# Patient Record
Sex: Female | Born: 1975 | Race: White | Hispanic: No | Marital: Married | Smoking: Current every day smoker
Health system: Southern US, Community
[De-identification: ages and names within clinical notes are randomized; demographics above are authoritative.]

## PROBLEM LIST (undated history)

## (undated) ENCOUNTER — Emergency Department: Discharge status: Absent without leave | Payer: Self-pay

## (undated) DIAGNOSIS — R569 Unspecified convulsions: Secondary | ICD-10-CM

## (undated) DIAGNOSIS — J45909 Unspecified asthma, uncomplicated: Secondary | ICD-10-CM

## (undated) DIAGNOSIS — E079 Disorder of thyroid, unspecified: Secondary | ICD-10-CM

## (undated) HISTORY — PX: ABDOMINAL HYSTERECTOMY: SHX81

## (undated) HISTORY — PX: OTHER SURGICAL HISTORY: SHX169

## (undated) HISTORY — PX: ABDOMINAL SURGERY: SHX537

## (undated) HISTORY — PX: BACK SURGERY: SHX140

---

## 2005-05-15 ENCOUNTER — Emergency Department: Payer: Self-pay | Admitting: Unknown Physician Specialty

## 2006-03-31 ENCOUNTER — Emergency Department: Payer: Self-pay | Admitting: Emergency Medicine

## 2006-04-03 ENCOUNTER — Ambulatory Visit: Payer: Self-pay | Admitting: Emergency Medicine

## 2006-05-08 ENCOUNTER — Inpatient Hospital Stay: Payer: Self-pay | Admitting: Obstetrics and Gynecology

## 2006-08-04 ENCOUNTER — Emergency Department: Payer: Self-pay | Admitting: Emergency Medicine

## 2006-09-09 ENCOUNTER — Emergency Department: Payer: Self-pay | Admitting: Emergency Medicine

## 2006-12-03 ENCOUNTER — Emergency Department: Payer: Self-pay | Admitting: Internal Medicine

## 2006-12-03 ENCOUNTER — Other Ambulatory Visit: Payer: Self-pay

## 2007-04-29 ENCOUNTER — Emergency Department: Payer: Self-pay | Admitting: Emergency Medicine

## 2007-11-01 ENCOUNTER — Emergency Department: Payer: Self-pay

## 2007-11-01 ENCOUNTER — Other Ambulatory Visit: Payer: Self-pay

## 2008-06-28 ENCOUNTER — Other Ambulatory Visit: Payer: Self-pay

## 2008-06-28 ENCOUNTER — Emergency Department: Payer: Self-pay | Admitting: Emergency Medicine

## 2009-07-16 ENCOUNTER — Ambulatory Visit: Payer: Self-pay | Admitting: Family Medicine

## 2009-08-19 ENCOUNTER — Ambulatory Visit: Payer: Self-pay | Admitting: Gastroenterology

## 2009-08-24 ENCOUNTER — Ambulatory Visit: Payer: Self-pay | Admitting: Gastroenterology

## 2009-10-05 ENCOUNTER — Emergency Department: Payer: Self-pay

## 2009-12-05 ENCOUNTER — Emergency Department: Payer: Self-pay | Admitting: Emergency Medicine

## 2010-02-02 ENCOUNTER — Emergency Department: Payer: Self-pay | Admitting: Emergency Medicine

## 2010-11-23 ENCOUNTER — Inpatient Hospital Stay: Payer: Self-pay | Admitting: Internal Medicine

## 2010-12-29 ENCOUNTER — Emergency Department: Payer: Self-pay | Admitting: Emergency Medicine

## 2011-04-24 ENCOUNTER — Emergency Department: Payer: Self-pay | Admitting: Emergency Medicine

## 2011-07-21 ENCOUNTER — Inpatient Hospital Stay: Payer: Self-pay | Admitting: Internal Medicine

## 2011-11-24 LAB — BASIC METABOLIC PANEL
Anion Gap: 11 (ref 7–16)
Calcium, Total: 9.5 mg/dL (ref 8.5–10.1)
Chloride: 109 mmol/L — ABNORMAL HIGH (ref 98–107)
EGFR (African American): >60
EGFR (Non-African Amer.): >60
Glucose: 107 mg/dL — ABNORMAL HIGH (ref 65–99)
Osmolality: 287 (ref 275–301)
Sodium: 144 mmol/L (ref 136–145)

## 2011-11-24 LAB — CBC
HGB: 13.6 g/dL (ref 12.0–16.0)
MCH: 27.9 pg (ref 26.0–34.0)
MCV: 83 fL (ref 80–100)
Platelet: 169 10*3/uL (ref 150–440)
RBC: 4.89 10*6/uL (ref 3.80–5.20)

## 2011-11-24 LAB — TSH: Thyroid Stimulating Horm: <0.01 u[IU]/mL — ABNORMAL LOW

## 2011-11-25 ENCOUNTER — Inpatient Hospital Stay: Payer: Self-pay | Admitting: Specialist

## 2011-11-25 LAB — DRUG SCREEN, URINE
Benzodiazepine, Ur Scrn: NEGATIVE (ref ?–200)
Cannabinoid 50 Ng, Ur ~~LOC~~: POSITIVE (ref ?–50)
Cocaine Metabolite,Ur ~~LOC~~: NEGATIVE (ref ?–300)
MDMA (Ecstasy)Ur Screen: NEGATIVE (ref ?–500)
Methadone, Ur Screen: NEGATIVE (ref ?–300)
Opiate, Ur Screen: POSITIVE (ref ?–300)
Phencyclidine (PCP) Ur S: NEGATIVE (ref ?–25)

## 2011-11-25 LAB — PREGNANCY, URINE: Pregnancy Test, Urine: NEGATIVE m[IU]/mL

## 2011-11-25 LAB — T4, FREE: Free Thyroxine: 6.14 ng/dL — ABNORMAL HIGH (ref 0.76–1.46)

## 2011-11-25 LAB — TROPONIN I: Troponin-I: <0.02 ng/mL

## 2012-11-06 ENCOUNTER — Emergency Department: Payer: Self-pay | Admitting: Emergency Medicine

## 2012-12-12 ENCOUNTER — Emergency Department: Payer: Self-pay | Admitting: Emergency Medicine

## 2013-05-07 ENCOUNTER — Emergency Department: Payer: Self-pay | Admitting: Emergency Medicine

## 2013-08-20 ENCOUNTER — Emergency Department: Payer: Self-pay | Admitting: Emergency Medicine

## 2013-08-20 LAB — HEPATIC FUNCTION PANEL A (ARMC)
Albumin: 4.1 g/dL (ref 3.4–5.0)
Alkaline Phosphatase: 103 U/L (ref 50–136)
Bilirubin, Direct: <0.1 mg/dL (ref 0.00–0.20)
Bilirubin,Total: 0.3 mg/dL (ref 0.2–1.0)
SGOT(AST): 24 U/L (ref 15–37)
Total Protein: 7.3 g/dL (ref 6.4–8.2)

## 2013-08-20 LAB — URINALYSIS, COMPLETE
Blood: NEGATIVE
Glucose,UR: NEGATIVE mg/dL (ref 0–75)
Ketone: NEGATIVE
Leukocyte Esterase: NEGATIVE
Nitrite: NEGATIVE
Ph: 7 (ref 4.5–8.0)
Protein: NEGATIVE
Specific Gravity: 1.006 (ref 1.003–1.030)
Squamous Epithelial: 7

## 2013-08-20 LAB — CBC
MCH: 31.7 pg (ref 26.0–34.0)
MCHC: 35.7 g/dL (ref 32.0–36.0)
RBC: 4.7 10*6/uL (ref 3.80–5.20)
RDW: 13.7 % (ref 11.5–14.5)
WBC: 5.5 10*3/uL (ref 3.6–11.0)

## 2013-08-20 LAB — DRUG SCREEN, URINE
Barbiturates, Ur Screen: NEGATIVE (ref ?–200)
Benzodiazepine, Ur Scrn: NEGATIVE (ref ?–200)
Cannabinoid 50 Ng, Ur ~~LOC~~: NEGATIVE (ref ?–50)
MDMA (Ecstasy)Ur Screen: NEGATIVE (ref ?–500)
Opiate, Ur Screen: NEGATIVE (ref ?–300)
Tricyclic, Ur Screen: NEGATIVE (ref ?–1000)

## 2013-08-20 LAB — BASIC METABOLIC PANEL
Anion Gap: 7 (ref 7–16)
BUN: 7 mg/dL (ref 7–18)
Chloride: 106 mmol/L (ref 98–107)
EGFR (African American): >60
Osmolality: 273 (ref 275–301)

## 2013-08-20 LAB — PREGNANCY, URINE: Pregnancy Test, Urine: NEGATIVE m[IU]/mL

## 2013-08-20 LAB — MAGNESIUM: Magnesium: 1.6 mg/dL — ABNORMAL LOW

## 2013-08-20 LAB — TSH: Thyroid Stimulating Horm: 5.52 u[IU]/mL — ABNORMAL HIGH

## 2013-10-29 ENCOUNTER — Emergency Department: Payer: Self-pay | Admitting: Emergency Medicine

## 2014-02-25 ENCOUNTER — Ambulatory Visit: Payer: Self-pay | Admitting: Emergency Medicine

## 2014-04-15 ENCOUNTER — Ambulatory Visit: Payer: Self-pay | Admitting: Family Medicine

## 2014-06-21 ENCOUNTER — Emergency Department: Payer: Self-pay

## 2014-11-02 ENCOUNTER — Emergency Department: Payer: Self-pay | Admitting: Emergency Medicine

## 2014-11-02 LAB — CBC WITH DIFFERENTIAL/PLATELET
Basophil #: 0.1 10*3/uL (ref 0.0–0.1)
Basophil %: 1.2 %
EOS ABS: 0.2 10*3/uL (ref 0.0–0.7)
Eosinophil %: 4.2 %
HCT: 43.6 % (ref 35.0–47.0)
HGB: 14.7 g/dL (ref 12.0–16.0)
Lymphocyte #: 1.5 10*3/uL (ref 1.0–3.6)
Lymphocyte %: 27.9 %
MCH: 31.8 pg (ref 26.0–34.0)
MCHC: 33.8 g/dL (ref 32.0–36.0)
MCV: 94 fL (ref 80–100)
MONO ABS: 0.3 x10 3/mm (ref 0.2–0.9)
Monocyte %: 5.6 %
Neutrophil #: 3.4 10*3/uL (ref 1.4–6.5)
Neutrophil %: 61.1 %
PLATELETS: 164 10*3/uL (ref 150–440)
RBC: 4.63 10*6/uL (ref 3.80–5.20)
RDW: 13.6 % (ref 11.5–14.5)
WBC: 5.5 10*3/uL (ref 3.6–11.0)

## 2014-11-02 LAB — COMPREHENSIVE METABOLIC PANEL
ALT: 17 U/L (ref 14–63)
AST: 24 U/L (ref 15–37)
Albumin: 3.8 g/dL (ref 3.4–5.0)
Alkaline Phosphatase: 71 U/L (ref 46–116)
Anion Gap: 6 — ABNORMAL LOW (ref 7–16)
BUN: 13 mg/dL (ref 7–18)
Bilirubin,Total: 0.2 mg/dL (ref 0.2–1.0)
CHLORIDE: 110 mmol/L — AB (ref 98–107)
Calcium, Total: 6.8 mg/dL — CL (ref 8.5–10.1)
Co2: 26 mmol/L (ref 21–32)
Creatinine: 1.03 mg/dL (ref 0.60–1.30)
EGFR (Non-African Amer.): >60
Glucose: 76 mg/dL (ref 65–99)
Osmolality: 282 (ref 275–301)
Potassium: 3.6 mmol/L (ref 3.5–5.1)
Sodium: 142 mmol/L (ref 136–145)
TOTAL PROTEIN: 7.1 g/dL (ref 6.4–8.2)

## 2014-11-02 LAB — TROPONIN I

## 2014-11-02 LAB — LIPASE, BLOOD: Lipase: 132 U/L (ref 73–393)

## 2015-01-25 NOTE — Discharge Summary (Signed)
PATIENT NAME:  Debbie [name], Debbie [name] MR#:  [id number] DATE OF BIRTH:  11/01/1975  DATE OF ADMISSION:  11/25/2011 DATE OF DISCHARGE:  11/25/2011  For [name] detailed note, please take [name] look at the history and physical done on admission by Dr. Rudene Rearwish.   DIAGNOSES AT DISCHARGE:  1. Chest pain likely musculoskeletal in nature.  2. Hypothyroidism, noncompliant with her medication.   3. Asthma.   4. Chronic obstructive pulmonary disease with ongoing tobacco abuse.   DIET: The patient was discharged on [name] regular diet.   ACTIVITY: As tolerated. The patient was told to [facility] so she can get help with her medications.   DISCHARGE MEDICATIONS:  1. Albuterol inhaler 2 puffs q.6 hours as needed  2. Tapazole 10 mg q.8 hours.  3. Toprol 25 mg daily.   PERTINENT STUDIES DONE DURING THE HOSPITAL COURSE: CT scan of the chest done with contrast showing no evidence of pulmonary or arterial embolic disease.   HOSPITAL COURSE: This is [name] 39 year old female with medical problems as mentioned above who presented to the hospital with chest pain and palpitations.  1. Chest pain. The patient's chest pain was likely related to musculoskeletal in nature and no evidence of any cardiac or pulmonary disease. She had [name] CT of the chest which showed no evidence of any pulmonary emboli. She had 3 sets of troponins checked which were negative. She was told to take some Tylenol and Motrin as needed for her chest pain as an outpatient.  2. Palpitations. This was secondary to tachycardia from pt's Hyperthyroid state, Graves disease. The patient has [name] history of Graves disease and is supposed to be on methimazole but has not been taking it for the past 2 to 3 months secondary to financial issues. Tachycardia was controlled with some Toprol, and she was discharged on some Toprol. Her heart rate has slowed down after being on beta blockers.  3. Graves disease with history of hyperthyroidism. The patient was in the  hospital for hyperthyroid state in October of last year, was discharged on Tapazole and was [facility]. The patient has not been taking her methimazole as she is supposed to for the past 2 months due to financial reasons. I got Case Management involved. Case Manager was able to get her the Tapazole at [name] cheaper price [facility] where the copay would only be $15 but patient said she could not afford that either. The patient was therefore given [name] week's worth of Tapazole and then told to [facility]. I spoke to Dr. Tedd SiasSolum who said that if the patient shows [facility] and shows her that she cannot afford her medications and show them [name] bank statement, she should be able to get her medications pretty much for free. The patient I think is noncompliant and did not want to take her medications but did not want to go through this process. The best we could do provide her medications for [name] week and for her to [facility] regarding her hyperthyroid state. The patient eventually is to get [name] radioactive ablation and also possible thyroidectomy, but this is after she has been compliant with her methimazole and the thyroid gland has sort of calmed down.  4. Chronic obstructive pulmonary disease/asthma with ongoing tobacco abuse. The patient did not have any evidence of acute exacerbation. She will take her albuterol inhaler as needed.  CODE STATUS: THE PATIENT IS [name] FULL CODE.   TIME SPENT: 35 minutes.     ____________________________ Rolly Pancake. Cherlynn Kaiser, MD vjs:vtd D: 11/25/2011 15:59:25 ET T: 11/26/2011 12:09:53 ET JOB#: [id number]  cc: Rolly Pancake. Cherlynn Kaiser, MD, <Dictator> Houston Siren MD ELECTRONICALLY SIGNED 11/30/2011 15:06

## 2015-01-25 NOTE — H&P (Signed)
PATIENT NAME:  Edwinna AreolaHORNTON, Alyah A MR#:  [id number] DATE OF BIRTH:  1976-06-06  DATE OF ADMISSION:  11/25/2011  PRIMARY CARE PHYSICIAN: Dr. Gustavo Laheresa Fralix    CHIEF COMPLAINT: Mouth numbness, palpitations, left-sided chest pain.   HISTORY OF PRESENT ILLNESS: Ms. Talmadge Coventryhornton is a 39 year old Caucasian female known case of Graves disease and treated for hyperthyroidism last admission when she presented with left-sided chest pain and palpitations. It is unfortunate that this patient had lost her Medicaid. She did not take her medications, in particular the Toprol and methimazole. The plan was for her to have either radioactive iodine ablation or surgery after control of her thyrotoxic state, however, she keeps losing her Medicaid and she does not take her medications. The patient came to the Emergency Room when she called her endocrinologist reporting that she has perioral numbness associated with tremors and palpitations, then later she developed left-sided chest pain described as dull aching pain with occasional shocking pain chest pain every now and then. She said it is severe reaching a 10 on a scale of 10. There is no associated shortness of breath. No syncope or near syncope.   REVIEW OF SYSTEMS: CONSTITUTIONAL: Denies fever. No chills. Reports sweating. No fatigue. EYES: No blurring of vision. No double vision. ENT: No hearing impairment. No sore throat. No dysphagia. CARDIOVASCULAR: Reports chest pain. No shortness of breath. No edema. No syncope. RESPIRATORY: No shortness of breath. No cough. No sputum production. GASTROINTESTINAL: No abdominal pain. Has occasional nausea for which she takes [place]. No vomiting. No diarrhea. GENITOURINARY: No dysuria or frequency of urination. MUSCULOSKELETAL: No joint pain or swelling. No muscular pain or swelling. INTEGUMENTARY: No skin rash. No ulcers. NEUROLOGIC: No focal weakness. No seizure activity. No headache. PSYCHIATRY: She has some anxiety. No depression.  ENDOCRINE: Reports sweating, heat and cold intolerance.   PAST MEDICAL HISTORY:  1. History of Graves disease. 2. Hyperthyroidism.  3. History of asthma.  4. Tobacco abuse. 5. History of partial hysterectomy.   SOCIAL HABITS: Chronic smoker, 1 pack per day since age 39. No history of alcohol or other drug abuse.   SOCIAL HISTORY: She is married, living with her husband. 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   FAMILY HISTORY: Reporting he mother had hypertension.   ADMISSION MEDICATIONS: None. She takes only Phenergan.   ALLERGIES: Aspirin causing mild rash and morphine also causes itching and mild rash.   PHYSICAL EXAMINATION:  VITAL SIGNS: Blood pressure 120/49, pulse 124 per minute, respiratory rate 20, temperature 97.8, oxygen saturation 97%.   GENERAL APPEARANCE: Young female lying in bed in no acute distress, thin looking.   HEAD AND NECK EXAMINATION: No pallor. No icterus. No cyanosis.   ENT: Hearing was normal. Nasal mucosa, lips, tongue were normal. The tongue is pierced.   EYES: Normal iris and conjunctivae. Pupils about 8 mm, equal and reactive to light.   NECK: Supple. Trachea at midline. There is thyromegaly diffuse more so on right than the left. There is bruit heard on both sides of the thyroid gland.   HEART: Normal S1, S2. No S3, S4. There is grade 1/6 systolic murmur at the left sternal border and aortic area.   RESPIRATORY: Normal breathing pattern without use of accessory muscles. No rales. No wheezing.   ABDOMEN: Soft without tenderness. No hepatosplenomegaly. No masses. No hernias.   SKIN: No ulcers. No subcutaneous nodules.   MUSCULOSKELETAL: No joint swelling. No clubbing.   NEUROLOGIC: Cranial nerves II through XII are intact. No focal  motor deficit. She has bilateral hand fine tremors.   LABORATORY, DIAGNOSTIC AND RADIOLOGICAL DATA: Serum glucose 107, BUN 12, creatinine 0.6, sodium 144, potassium 4.4. Troponin less than 0.02. TSH was less than  0.010. CBC showed white count of 7600, hemoglobin 13, hematocrit 40, platelet count 169. D-dimer was elevated at 0.6, however, CT scan of the chest was negative for pulmonary embolism as preliminary results.   ASSESSMENT:  1. Left-sided chest pain, etiology is unclear, but this is same presentation of last time when she presented with thyrotoxicosis associated with chest pain. At that time it was thought musculoskeletal chest pain.  2. Thyroid toxicosis. 3. Graves disease.  4. Sinus tachycardia secondary to hyperthyroidism.  5. Tobacco abuse.  6. History of asthma.  7. Noncompliance.   PLAN: Admit the patient to telemetry monitoring. Follow cardiac enzymes q.8 hours. Resume beta blocker using metoprolol at 50 mg twice a day. Restart Tapazole at 10 mg 3 times a day. I would like to mention that the patient was evaluated last admission by endocrinology and they recommended medical treatment until the thyroid state is controlled then to consider surgical option versus radioactive iodine ablation. Patient, however, keeps stopping her medications and we are back to square zero. I consulted social services to look at patient financial issue and to see if there is any way to help her to get her medications. Deep vein thrombosis prophylaxis will be initiated with Lovenox 40 mg subcutaneous daily. Peptic ulcer disease prophylaxis with Protonix 40 mg p.o. daily.   TIME NEEDED TO EVALUATE THIS PATIENT: More than 45 minutes.   ____________________________ Carney Corners. Rudene Re, MD amd:cms D: 11/25/2011 04:37:37 ET T: 11/25/2011 06:47:58 ET  JOB#: [id number] cc: Carney Corners. Rudene Re, MD, <Dictator> Paul Half. Bernette Mayers, MD Zollie Scale MD ELECTRONICALLY SIGNED 11/25/2011 22:25

## 2015-02-02 ENCOUNTER — Emergency Department
Admission: EM | Admit: 2015-02-02 | Discharge: 2015-02-02 | Disposition: A | Discharge status: Home / Self Care | Payer: Self-pay | Attending: Emergency Medicine | Admitting: Emergency Medicine

## 2015-02-02 ENCOUNTER — Encounter: Payer: Self-pay | Admitting: Emergency Medicine

## 2015-02-02 DIAGNOSIS — Y998 Other external cause status: Secondary | ICD-10-CM | POA: Insufficient documentation

## 2015-02-02 DIAGNOSIS — W010XXA Fall on same level from slipping, tripping and stumbling without subsequent striking against object, initial encounter: Secondary | ICD-10-CM | POA: Insufficient documentation

## 2015-02-02 DIAGNOSIS — Y9389 Activity, other specified: Secondary | ICD-10-CM | POA: Insufficient documentation

## 2015-02-02 DIAGNOSIS — S299XXA Unspecified injury of thorax, initial encounter: Secondary | ICD-10-CM | POA: Insufficient documentation

## 2015-02-02 DIAGNOSIS — Y9289 Other specified places as the place of occurrence of the external cause: Secondary | ICD-10-CM | POA: Insufficient documentation

## 2015-02-02 DIAGNOSIS — Z720 Tobacco use: Secondary | ICD-10-CM | POA: Insufficient documentation

## 2015-02-02 DIAGNOSIS — S0993XA Unspecified injury of face, initial encounter: Secondary | ICD-10-CM | POA: Insufficient documentation

## 2015-02-02 DIAGNOSIS — Z79899 Other long term (current) drug therapy: Secondary | ICD-10-CM | POA: Insufficient documentation

## 2015-02-02 DIAGNOSIS — S46911A Strain of unspecified muscle, fascia and tendon at shoulder and upper arm level, right arm, initial encounter: Secondary | ICD-10-CM | POA: Insufficient documentation

## 2015-02-02 HISTORY — DX: Unspecified convulsions: R56.9

## 2015-02-02 HISTORY — DX: Disorder of thyroid, unspecified: E07.9

## 2015-02-02 MED ORDER — KETOROLAC TROMETHAMINE 10 MG PO TABS: 10.0000 mg | ORAL_TABLET | Freq: Three times a day (TID) | ORAL | Status: AC

## 2015-02-02 MED ORDER — CYCLOBENZAPRINE HCL 5 MG PO TABS: 5.0000 mg | ORAL_TABLET | Freq: Three times a day (TID) | ORAL | Status: AC | PRN

## 2015-02-02 NOTE — ED Notes (Signed)
Patient to ED with continued pain to right forehead, shoulder and upper chest area after fall on Thursday. Patient also reports some continued issues with dizziness and headache.

## 2015-02-02 NOTE — ED Provider Notes (Signed)
[facility] Emergency Department Provider Note  ____________________________________________  Time seen: 1725  I have reviewed the triage vital signs and the nursing notes.   HISTORY  Chief Complaint Fall and Shoulder Pain  HPI Debbie Page is a 39 y.o. female with continued c/o right shoulder pain s/p fall on Thursday.  She tripped over the gas tank hose and fell hurting the right shoulder and right face.  She originally treated at [facility] ED. Provider there, she reports did not suspect facial fractures.  She was discharged with naproxen and after care instructions.  She returns today with pain now noted to the right shoulder and upper chest wall, which started a day later.  She notes pain is not relieved with naproxen.    Past Medical History  Diagnosis Date  . Thyroid disease   . Seizures     There are no active problems to display for this patient.   History reviewed. No pertinent past surgical history.  Current Outpatient Rx  Name  Route  Sig  Dispense  Refill  . calcium carbonate (OS-CAL) 1250 (500 CA) MG chewable tablet   Oral   Chew 1 tablet by mouth daily.         . carbamazepine (TEGRETOL XR) 200 MG 12 hr tablet   Oral   Take 200 mg by mouth 2 (two) times daily.         Marland Kitchen levothyroxine (SYNTHROID, LEVOTHROID) 125 MCG tablet   Oral   Take 125 mcg by mouth daily before breakfast.         . cyclobenzaprine (FLEXERIL) 5 MG tablet   Oral   Take 1 tablet (5 mg total) by mouth every 8 (eight) hours as needed for muscle spasms.   15 tablet   0   . ketorolac (TORADOL) 10 MG tablet   Oral   Take 1 tablet (10 mg total) by mouth every 8 (eight) hours.   15 tablet   0     Allergies Aspirin; Morphine and related; and Norco  History reviewed. No pertinent family history.  Social History History  Substance Use Topics  . Smoking status: Current Every Day Smoker  . Smokeless tobacco: Not on file  . Alcohol Use: No    Review  of Systems Constitutional: Negative for fever. Eyes: Negative for visual changes. ENT: Negative for sore throat. Cardiovascular: Positive for chest wall pain. Pain reproducible with touch. Negative for chest pain. Denies palpitation, syncope. Respiratory: Negative for shortness of breath. Gastrointestinal: Negative for abdominal pain, vomiting and diarrhea. Genitourinary: Negative for dysuria. Musculoskeletal: Negative for back pain. Skin: Negative for rash. Neurological: Negative for headaches, focal weakness or numbness.  10-point ROS otherwise negative.  ____________________________________________   PHYSICAL EXAM:  VITAL SIGNS: ED Triage Vitals  Enc Vitals Group     BP 02/02/15 1711 123/83 mmHg     Pulse Rate 02/02/15 1711 77     Resp 02/02/15 1711 18     Temp 02/02/15 1711 98.4 F (36.9 C)     Temp Source 02/02/15 1711 Oral     SpO2 02/02/15 1711 96 %     Weight 02/02/15 1711 135 lb (61.236 kg)     Height 02/02/15 1711  (1.575 m)     Head Cir --      Peak Flow --      Pain Score 02/02/15 1712 7     Pain Loc --      Pain Edu? --  Excl. in GC? --     Constitutional: Alert and oriented. Well appearing and in no distress. Eyes: Conjunctivae are normal. PERRL. Normal extraocular movements. ENT   Head: Normocephalic and atraumatic.   Nose: No congestion/rhinnorhea.   Mouth/Throat: Mucous membranes are moist.   Neck: No stridor. Hematological/Lymphatic/Immunilogical: No cervical lymphadenopathy. Cardiovascular: Normal rate, regular rhythm. Normal and symmetric distal pulses are present in all extremities. No murmurs, rubs, or gallops. Respiratory: Normal respiratory effort without tachypnea nor retractions. Breath sounds are clear and equal bilaterally. No wheezes/rales/rhonchi. Gastrointestinal: Soft and nontender. No distention. No abdominal bruits. There is no CVA tenderness. Genitourinary: deferred Musculoskeletal: Nontender with normal range  of motion in all extremities. No joint effusions.  Right shoulder without deformity.  Normal rotator cuff strength.   Neurologic:  Normal speech and language. No gross focal neurologic deficits are appreciated. Speech is normal. No gait instability. Normal grip and DTRs bilaterally.  Skin:  Skin is warm, dry and intact. No rash noted. Psychiatric: Mood and affect are normal. Speech and behavior are normal. Patient exhibits appropriate insight and judgment.  ____________________________________________    LABS (pertinent positives/negatives)  None   ____________________________________________   EKG  none  ____________________________________________    RADIOLOGY  none  ____________________________________________   PROCEDURES  Procedure(s) performed: None  Critical Care performed: No  ____________________________________________   INITIAL IMPRESSION / ASSESSMENT AND PLAN / ED COURSE  Normal musculoskeletal exam.  Delayed onset of muscle soreness s/p fall.   Pertinent labs & imaging results that were available during my care of the patient were reviewed by me and considered in my medical decision making (see chart for details).   ____________________________________________   FINAL CLINICAL IMPRESSION(S) / ED DIAGNOSES  Final diagnoses:  Shoulder strain, right, initial encounter  Fall from slip, trip, or stumble, initial encounter    Lissa HoardJenise V Bacon Rai Severns, PA-C 02/02/15 1758

## 2015-02-02 NOTE — Discharge Instructions (Signed)
Dose the prescription meds as directed.  Apply ice to reduce symptoms.

## 2015-03-25 ENCOUNTER — Emergency Department
Admission: EM | Admit: 2015-03-25 | Discharge: 2015-03-25 | Disposition: A | Discharge status: Home / Self Care | Payer: Self-pay | Attending: Emergency Medicine | Admitting: Emergency Medicine

## 2015-03-25 ENCOUNTER — Emergency Department: Payer: Self-pay

## 2015-03-25 ENCOUNTER — Encounter: Payer: Self-pay | Admitting: Emergency Medicine

## 2015-03-25 DIAGNOSIS — Z79899 Other long term (current) drug therapy: Secondary | ICD-10-CM | POA: Insufficient documentation

## 2015-03-25 DIAGNOSIS — Z792 Long term (current) use of antibiotics: Secondary | ICD-10-CM | POA: Insufficient documentation

## 2015-03-25 DIAGNOSIS — Z720 Tobacco use: Secondary | ICD-10-CM | POA: Insufficient documentation

## 2015-03-25 DIAGNOSIS — N3090 Cystitis, unspecified without hematuria: Secondary | ICD-10-CM | POA: Insufficient documentation

## 2015-03-25 HISTORY — DX: Unspecified asthma, uncomplicated: J45.909

## 2015-03-25 LAB — URINALYSIS COMPLETE WITH MICROSCOPIC (ARMC ONLY)
Bilirubin Urine: NEGATIVE
Glucose, UA: NEGATIVE mg/dL
HGB URINE DIPSTICK: NEGATIVE
Ketones, ur: NEGATIVE mg/dL
Leukocytes, UA: NEGATIVE
NITRITE: NEGATIVE
PROTEIN: NEGATIVE mg/dL
SPECIFIC GRAVITY, URINE: 1.014 (ref 1.005–1.030)
pH: 5 (ref 5.0–8.0)

## 2015-03-25 LAB — CBC WITH DIFFERENTIAL/PLATELET
BASOS PCT: 1 %
Basophils Absolute: 0 10*3/uL (ref 0–0.1)
EOS PCT: 2 %
Eosinophils Absolute: 0.1 10*3/uL (ref 0–0.7)
HEMATOCRIT: 42.9 % (ref 35.0–47.0)
HEMOGLOBIN: 14.6 g/dL (ref 12.0–16.0)
Lymphocytes Relative: 22 %
Lymphs Abs: 1.4 10*3/uL (ref 1.0–3.6)
MCH: 32 pg (ref 26.0–34.0)
MCHC: 34.1 g/dL (ref 32.0–36.0)
MCV: 93.8 fL (ref 80.0–100.0)
MONO ABS: 0.4 10*3/uL (ref 0.2–0.9)
MONOS PCT: 6 %
Neutro Abs: 4.5 10*3/uL (ref 1.4–6.5)
Neutrophils Relative %: 69 %
Platelets: 165 10*3/uL (ref 150–440)
RBC: 4.57 MIL/uL (ref 3.80–5.20)
RDW: 13.6 % (ref 11.5–14.5)
WBC: 6.4 10*3/uL (ref 3.6–11.0)

## 2015-03-25 LAB — COMPREHENSIVE METABOLIC PANEL
ALK PHOS: 68 U/L (ref 38–126)
ALT: 12 U/L — AB (ref 14–54)
AST: 15 U/L (ref 15–41)
Albumin: 4.3 g/dL (ref 3.5–5.0)
Anion gap: 9 (ref 5–15)
BUN: 12 mg/dL (ref 6–20)
CO2: 25 mmol/L (ref 22–32)
Calcium: 6.9 mg/dL — ABNORMAL LOW (ref 8.9–10.3)
Chloride: 106 mmol/L (ref 101–111)
Creatinine, Ser: 0.75 mg/dL (ref 0.44–1.00)
GFR calc non Af Amer: >60 mL/min (ref >60)
GLUCOSE: 88 mg/dL (ref 65–99)
Potassium: 3.6 mmol/L (ref 3.5–5.1)
SODIUM: 140 mmol/L (ref 135–145)
TOTAL PROTEIN: 7.3 g/dL (ref 6.5–8.1)
Total Bilirubin: 0.5 mg/dL (ref 0.3–1.2)

## 2015-03-25 MED ORDER — ONDANSETRON 4 MG PO TBDP
4.0000 mg | ORAL_TABLET | Freq: Once | ORAL | Status: AC
  Administered 2015-03-25: 4 mg via ORAL

## 2015-03-25 MED ORDER — OXYCODONE-ACETAMINOPHEN 5-325 MG PO TABS
2.0000 | ORAL_TABLET | Freq: Once | ORAL | Status: AC
  Administered 2015-03-25: 2 via ORAL

## 2015-03-25 MED ORDER — SODIUM CHLORIDE 0.9 % IV BOLUS (SEPSIS)
1000.0000 mL | Freq: Once | INTRAVENOUS | Status: AC
  Administered 2015-03-25: 1000 mL via INTRAVENOUS

## 2015-03-25 MED ORDER — IOHEXOL 240 MG/ML SOLN: 25.0000 mL | Freq: Once | INTRAMUSCULAR | Status: DC | PRN

## 2015-03-25 MED ORDER — HYDROMORPHONE HCL 1 MG/ML IJ SOLN
1.0000 mg | Freq: Once | INTRAMUSCULAR | Status: AC
Start: 2015-03-25 — End: 2015-03-25
  Administered 2015-03-25: 1 mg via INTRAVENOUS

## 2015-03-25 MED ORDER — ONDANSETRON 4 MG PO TBDP
ORAL_TABLET | ORAL | Status: AC
  Administered 2015-03-25: 4 mg via ORAL
  Filled 2015-03-25: qty 1

## 2015-03-25 MED ORDER — SULFAMETHOXAZOLE-TRIMETHOPRIM 800-160 MG PO TABS: 1.0000 | ORAL_TABLET | Freq: Two times a day (BID) | ORAL | Status: AC

## 2015-03-25 MED ORDER — ONDANSETRON HCL 4 MG/2ML IJ SOLN
INTRAMUSCULAR | Status: AC
Start: 2015-03-25 — End: 2015-03-25
  Administered 2015-03-25: 4 mg via INTRAVENOUS
  Filled 2015-03-25: qty 2

## 2015-03-25 MED ORDER — IOHEXOL 300 MG/ML  SOLN
100.0000 mL | Freq: Once | INTRAMUSCULAR | Status: AC | PRN
  Administered 2015-03-25: 100 mL via INTRAVENOUS

## 2015-03-25 MED ORDER — OXYCODONE-ACETAMINOPHEN 5-325 MG PO TABS
ORAL_TABLET | ORAL | Status: AC
  Administered 2015-03-25: 2 via ORAL
  Filled 2015-03-25: qty 2

## 2015-03-25 MED ORDER — HYDROMORPHONE HCL 1 MG/ML IJ SOLN
INTRAMUSCULAR | Status: AC
  Administered 2015-03-25: 1 mg via INTRAVENOUS
  Filled 2015-03-25: qty 1

## 2015-03-25 MED ORDER — ONDANSETRON HCL 4 MG/2ML IJ SOLN
4.0000 mg | Freq: Once | INTRAMUSCULAR | Status: AC
  Administered 2015-03-25: 4 mg via INTRAVENOUS

## 2015-03-25 MED ORDER — ONDANSETRON HCL 4 MG PO TABS: 4.0000 mg | ORAL_TABLET | Freq: Four times a day (QID) | ORAL | Status: AC | PRN

## 2015-03-25 NOTE — ED Notes (Signed)
Patient arrives to [facility] ed with c/o right lower back pain with radiation to RLQ. + nausea and dizziness. Denies vomting, alterations in bowel or bladder

## 2015-03-25 NOTE — Discharge Instructions (Signed)

## 2015-03-25 NOTE — ED Provider Notes (Signed)
[facility] Emergency Department Provider Note  ____________________________________________  Time seen: 1:00 PM  I have reviewed the triage vital signs and the nursing notes.   HISTORY  Chief Complaint Back Pain; Flank Pain; Nausea; and Dizziness    HPI Debbie Page is a 39 y.o. female who complains of right lower quadrant abdominal pain since last night. It was gradual in onset and initially very mild radiating to her right lower back, but has been constant and worsening over the last 12-24 hours. She also notes that she has had nausea and decreased appetite for the last 2 days. No vomiting or diarrhea. No fever chills chest pain shortness of breath headache syncope. There had anything like this before, no sick contacts.     Past Medical History  Diagnosis Date  . Thyroid disease   . Seizures   . Asthma     There are no active problems to display for this patient.   History reviewed. No pertinent past surgical history.  Current Outpatient Rx  Name  Route  Sig  Dispense  Refill  . calcium carbonate (OS-CAL) 1250 (500 CA) MG chewable tablet   Oral   Chew 1 tablet by mouth daily.         . carbamazepine (TEGRETOL XR) 200 MG 12 hr tablet   Oral   Take 200 mg by mouth 2 (two) times daily.         . cyclobenzaprine (FLEXERIL) 5 MG tablet   Oral   Take 1 tablet (5 mg total) by mouth every 8 (eight) hours as needed for muscle spasms.   15 tablet   0   . ketorolac (TORADOL) 10 MG tablet   Oral   Take 1 tablet (10 mg total) by mouth every 8 (eight) hours.   15 tablet   0   . levothyroxine (SYNTHROID, LEVOTHROID) 125 MCG tablet   Oral   Take 125 mcg by mouth daily before breakfast.         . ondansetron (ZOFRAN) 4 MG tablet   Oral   Take 1 tablet (4 mg total) by mouth every 6 (six) hours as needed for nausea or vomiting.   20 tablet   1   . sulfamethoxazole-trimethoprim (BACTRIM DS) 800-160 MG per tablet   Oral   Take 1  tablet by mouth 2 (two) times daily.   14 tablet   0     Allergies Aspirin and Morphine and related  History reviewed. No pertinent family history.  Social History History  Substance Use Topics  . Smoking status: Current Every Day Smoker  . Smokeless tobacco: Not on file  . Alcohol Use: No    Review of Systems  Constitutional: No fever or chills. No weight changes Eyes:No blurry vision or double vision.  ENT: No sore throat. Cardiovascular: No chest pain. Respiratory: No dyspnea or cough. Gastrointestinal: As above.  No BRBPR or melena. Genitourinary: Negative for dysuria, urinary retention, bloody urine, or difficulty urinating. Musculoskeletal: Negative for back pain. No joint swelling or pain. Skin: Negative for rash. Neurological: Negative for headaches, focal weakness or numbness. Psychiatric:No anxiety or depression.   Endocrine:No hot/cold intolerance, changes in energy, or sleep difficulty.  10-point ROS otherwise negative.  ____________________________________________   PHYSICAL EXAM:  VITAL SIGNS: ED Triage Vitals  Enc Vitals Group     BP 03/25/15 1119 109/71 mmHg     Pulse Rate 03/25/15 1119 70     Resp 03/25/15 1304 18  Temp 03/25/15 1119 98.6 F (37 C)     Temp Source 03/25/15 1119 Oral     SpO2 03/25/15 1119 92 %     Weight 03/25/15 1119 140 lb (63.504 kg)     Height 03/25/15 1119  (1.575 m)     Head Cir --      Peak Flow --      Pain Score 03/25/15 1119 7     Pain Loc --      Pain Edu? --      Excl. in GC? --      Constitutional: Alert and oriented. Well appearing and in no distress. Eyes: No scleral icterus. No conjunctival pallor. PERRL. EOMI ENT   Head: Normocephalic and atraumatic.   Nose: No congestion/rhinnorhea. No septal hematoma   Mouth/Throat: MMM, no pharyngeal erythema. No peritonsillar mass. No uvula shift.   Neck: No stridor. No SubQ emphysema. No meningismus. Hematological/Lymphatic/Immunilogical:  No cervical lymphadenopathy. Cardiovascular: RRR. Normal and symmetric distal pulses are present in all extremities. No murmurs, rubs, or gallops. Respiratory: Normal respiratory effort without tachypnea nor retractions. Breath sounds are clear and equal bilaterally. No wheezes/rales/rhonchi. Gastrointestinal: Right lower quadrant tenderness, positive obturator sign.. No distention. There is no CVA tenderness.  No rebound, rigidity, or guarding. Genitourinary: deferred Musculoskeletal: Nontender with normal range of motion in all extremities. No joint effusions.  No lower extremity tenderness.  No edema. Neurologic:   Normal speech and language.  CN 2-10 normal. Motor grossly intact. No pronator drift.  Normal gait. No gross focal neurologic deficits are appreciated.  Skin:  Skin is warm, dry and intact. No rash noted.  No petechiae, purpura, or bullae. Psychiatric: Mood and affect are normal. Speech and behavior are normal. Patient exhibits appropriate insight and judgment.  ____________________________________________    LABS (pertinent positives/negatives) (all labs ordered are listed, but only abnormal results are displayed) Labs Reviewed  COMPREHENSIVE METABOLIC PANEL - Abnormal; Notable for the following:    Calcium 6.9 (*)    ALT 12 (*)    All other components within normal limits  URINALYSIS COMPLETEWITH MICROSCOPIC ([facility] ONLY) - Abnormal; Notable for the following:    Color, Urine YELLOW (*)    APPearance HAZY (*)    Bacteria, UA RARE (*)    Squamous Epithelial / LPF 0-5 (*)    All other components within normal limits  URINE CULTURE  CBC WITH DIFFERENTIAL/PLATELET   ____________________________________________   EKG    ____________________________________________    RADIOLOGY  CT abdomen and pelvis unremarkable  ____________________________________________   PROCEDURES  ____________________________________________   INITIAL IMPRESSION / ASSESSMENT  AND PLAN / ED COURSE  Pertinent labs & imaging results that were available during my care of the patient were reviewed by me and considered in my medical decision making (see chart for details).  Symptoms and exam concerning for acute appendicitis. Low suspicion for torsion or ectopic pregnancy. Pregnancy test is negative by report from RN. Urinalysis does not reveal acute clear urinary tract infection. We will proceed with CT scan of the abdomen and pelvis for further evaluation. IV fluids Zofran and Dilaudid for symptom relief.  ----------------------------------------- 5:01 PM on 03/25/2015 -----------------------------------------  Patient feels better, tolerating oral intake. Discussed her workup results with her. Urine does show 5-30 red blood cells 5-30 white blood cells and white blood cell clumps, suggestive of urinary tract infection in the absence of any other findings. On repeat exam, the right side is nontender but she does have some mild suprapubic tenderness. We'll  treat her for cystitis and discharged home. Patient counseled to return to the ED if she has worsening symptoms.  ____________________________________________   FINAL CLINICAL IMPRESSION(S) / ED DIAGNOSES  Final diagnoses:  Cystitis      Sharman Cheek, MD 03/25/15 [phone]

## 2015-03-27 LAB — URINE CULTURE

## 2015-04-21 ENCOUNTER — Telehealth: Payer: Self-pay | Admitting: Nurse Practitioner

## 2015-04-21 DIAGNOSIS — M545 Low back pain: Secondary | ICD-10-CM

## 2015-04-21 MED ORDER — NAPROXEN 500 MG PO TABS: 500.0000 mg | ORAL_TABLET | Freq: Two times a day (BID) | ORAL | Status: AC

## 2015-04-21 MED ORDER — CYCLOBENZAPRINE HCL 10 MG PO TABS: 10.0000 mg | ORAL_TABLET | Freq: Three times a day (TID) | ORAL | Status: DC | PRN

## 2015-04-21 NOTE — Progress Notes (Signed)
We are sorry that you are not feeling well.  Here is how we plan to help!  Based on what you have shared with me it looks like you mostly have acute back pain.  Acute back pain is defined as musculoskeletal pain that can resolve in 1-3 weeks with conservative treatment.  I have prescribed Naprosyn 500 mg twice a day non-steroid anti-inflammatory (NSAID) as well as Flexeril 10 mg every eight hours as needed which is a muscle relaxer.  Some patients experience stomach irritation or in increased heartburn with anti-inflammatory drugs.  Please keep in mind that muscle relaxer's can cause fatigue and should not be taken while at work or driving.  Back pain is very common.  The pain often gets better over time.  The cause of back pain is usually not dangerous.  Most people can learn to manage their back pain on their own.  Home Care  Stay active.  Start with short walks on flat ground if you can.  Try to walk farther each day.  Do not sit, drive or stand in one place for more than 30 minutes.  Do not stay in bed.  Do not avoid exercise or work.  Activity can help your back heal faster.  Be careful when you bend or lift an object.  Bend at your knees, keep the object close to you, and do not twist.  Sleep on a firm mattress.  Lie on your side, and bend your knees.  If you lie on your back, put a pillow under your knees.  Only take medicines as told by your doctor.  Put ice on the injured area.  Put ice in a plastic bag  Place a towel between your skin and the bag  Leave the ice on for 15-20 minutes, 3-4 times a day for the first 2-3 days.  After that, you can switch between ice and heat packs.  Ask your doctor about back exercises or massage.  Avoid feeling anxious or stressed.  Find good ways to deal with stress, such as exercise.  Get Help Right Way If:  Your pain does not go away with rest or medicine.  Your pain does not go away in 1 week.  You have new problems.  You do not  feel well.  The pain spreads into your legs.  You cannot control when you poop (bowel movement) or pee (urinate)  You feel sick to your stomach (nauseous) or throw up (vomit)  You have belly (abdominal) pain.  You feel like you may pass out (faint).  If you develop a fever.  Make Sure you:  Understand these instructions.  Will watch your condition  Will get help right away if you are not doing well or get worse.  Your e-visit answers were reviewed by a board certified advanced clinical practitioner to complete your personal care plan.  Depending on the condition, your plan could have included both over the counter or prescription medications.  If there is a problem please reply  once you have received a response from your provider.  Your safety is important to us.  If you have drug allergies check your prescription carefully.    You can use MyChart to ask questions about today's visit, request a non-urgent call back, or ask for a work or school excuse.  You will get an e-mail in the next two days asking about your experience.  I hope that your e-visit has been valuable and will speed your recovery. Thank you   for using e-visits.   

## 2015-06-23 ENCOUNTER — Emergency Department
Admission: EM | Admit: 2015-06-23 | Discharge: 2015-06-23 | Disposition: A | Discharge status: Home / Self Care | Payer: Self-pay | Attending: Emergency Medicine | Admitting: Emergency Medicine

## 2015-06-23 ENCOUNTER — Encounter: Payer: Self-pay | Admitting: Emergency Medicine

## 2015-06-23 DIAGNOSIS — S63501A Unspecified sprain of right wrist, initial encounter: Secondary | ICD-10-CM

## 2015-06-23 DIAGNOSIS — W1839XA Other fall on same level, initial encounter: Secondary | ICD-10-CM | POA: Insufficient documentation

## 2015-06-23 DIAGNOSIS — Y9389 Activity, other specified: Secondary | ICD-10-CM | POA: Insufficient documentation

## 2015-06-23 DIAGNOSIS — Z720 Tobacco use: Secondary | ICD-10-CM | POA: Insufficient documentation

## 2015-06-23 DIAGNOSIS — Y9289 Other specified places as the place of occurrence of the external cause: Secondary | ICD-10-CM | POA: Insufficient documentation

## 2015-06-23 DIAGNOSIS — Y998 Other external cause status: Secondary | ICD-10-CM | POA: Insufficient documentation

## 2015-06-23 DIAGNOSIS — Z79899 Other long term (current) drug therapy: Secondary | ICD-10-CM | POA: Insufficient documentation

## 2015-06-23 DIAGNOSIS — S8001XA Contusion of right knee, initial encounter: Secondary | ICD-10-CM

## 2015-06-23 DIAGNOSIS — Z791 Long term (current) use of non-steroidal anti-inflammatories (NSAID): Secondary | ICD-10-CM | POA: Insufficient documentation

## 2015-06-23 NOTE — ED Provider Notes (Signed)
[facility] Emergency Department Provider Note  ____________________________________________  Time seen: On arrival  I have reviewed the triage vital signs and the nursing notes.   HISTORY  Chief Complaint Fall    HPI Debbie Page is a 39 y.o. female who reports she fell earlier today and caught herself with her right wrist. She complains of mild right palmar tenderness and a bruise to the right knee. No head injury. No other complaints. She was ambulate well. She is moving her wrist well.    Past Medical History  Diagnosis Date  . Thyroid disease   . Seizures   . Asthma     There are no active problems to display for this patient.   History reviewed. No pertinent past surgical history.  Current Outpatient Rx  Name  Route  Sig  Dispense  Refill  . calcium carbonate (OS-CAL) 1250 (500 CA) MG chewable tablet   Oral   Chew 1 tablet by mouth daily.         . carbamazepine (TEGRETOL XR) 200 MG 12 hr tablet   Oral   Take 200 mg by mouth 2 (two) times daily.         . cyclobenzaprine (FLEXERIL) 10 MG tablet   Oral   Take 1 tablet (10 mg total) by mouth 3 (three) times daily as needed for muscle spasms.   30 tablet   1   . cyclobenzaprine (FLEXERIL) 5 MG tablet   Oral   Take 1 tablet (5 mg total) by mouth every 8 (eight) hours as needed for muscle spasms.   15 tablet   0   . ketorolac (TORADOL) 10 MG tablet   Oral   Take 1 tablet (10 mg total) by mouth every 8 (eight) hours.   15 tablet   0   . levothyroxine (SYNTHROID, LEVOTHROID) 125 MCG tablet   Oral   Take 125 mcg by mouth daily before breakfast.         . naproxen (NAPROSYN) 500 MG tablet   Oral   Take 1 tablet (500 mg total) by mouth 2 (two) times daily with a meal.   60 tablet   1   . ondansetron (ZOFRAN) 4 MG tablet   Oral   Take 1 tablet (4 mg total) by mouth every 6 (six) hours as needed for nausea or vomiting.   20 tablet   1   .  sulfamethoxazole-trimethoprim (BACTRIM DS) 800-160 MG per tablet   Oral   Take 1 tablet by mouth 2 (two) times daily.   14 tablet   0     Allergies Aspirin and Morphine and related  No family history on file.  Social History Social History  Substance Use Topics  . Smoking status: Current Every Day Smoker  . Smokeless tobacco: None  . Alcohol Use: No    Review of Systems  Constitutional: Negative for fever. Eyes: Negative for visual changes. ENT: Negative for sore throat   Genitourinary: Negative for dysuria. Musculoskeletal: Negative for back pain. Skin: Negative for rash. Neurological: Negative for headaches or focal weakness   ____________________________________________   PHYSICAL EXAM:  VITAL SIGNS: ED Triage Vitals  Enc Vitals Group     BP 06/23/15 1033 113/68 mmHg     Pulse Rate 06/23/15 1033 87     Resp 06/23/15 1033 18     Temp 06/23/15 1033 98.4 F (36.9 C)     Temp Source 06/23/15 1033 Oral     SpO2 06/23/15  1033 94 %     Weight --      Height --      Head Cir --      Peak Flow --      Pain Score 06/23/15 1033 6     Pain Loc --      Pain Edu? --      Excl. in GC? --      Constitutional: Alert and oriented. Well appearing and in no distress. Eyes: Conjunctivae are normal.  ENT   Head: Normocephalic and atraumatic.   Mouth/Throat: Mucous membranes are moist. Cardiovascular: Normal rate, regular rhythm.  Respiratory: Normal respiratory effort without tachypnea nor retractions.  Gastrointestinal: Soft and non-tender in all quadrants. No distention. There is no CVA tenderness. Musculoskeletal: Nontender with normal range of motion in all extremities. No swelling or tenderness to palpation of the right wrist. Small bruise to the medial portion of the right knee but full range of motion and patient ambulates well Neurologic:  Normal speech and language. No gross focal neurologic deficits are appreciated. Skin:  Skin is warm, dry and  intact. No rash noted. Psychiatric: Mood and affect are normal. Patient exhibits appropriate insight and judgment.  ____________________________________________    LABS (pertinent positives/negatives)  Labs Reviewed - No data to display  ____________________________________________     ____________________________________________    RADIOLOGY I have personally reviewed any xrays that were ordered on this patient: None  ____________________________________________   PROCEDURES  Procedure(s) performed: none   ____________________________________________   INITIAL IMPRESSION / ASSESSMENT AND PLAN / ED COURSE  Pertinent labs & imaging results that were available during my care of the patient were reviewed by me and considered in my medical decision making (see chart for details).  Patient well-appearing with benign exam. No concern for fracture. Suspect mild respiratory and knee contusion. Recommend supportive care.  ____________________________________________   FINAL CLINICAL IMPRESSION(S) / ED DIAGNOSES  Final diagnoses:  Wrist sprain, right, initial encounter  Knee contusion, right, initial encounter     Jene Every, MD 06/23/15 1807

## 2015-06-23 NOTE — ED Notes (Signed)
Fell having pain to knee,hand and back

## 2015-06-23 NOTE — Discharge Instructions (Signed)
Contusion °A contusion is a deep bruise. Contusions happen when an injury causes bleeding under the skin. Signs of bruising include pain, puffiness (swelling), and discolored skin. The contusion may turn blue, purple, or yellow. °HOME CARE  °· Put ice on the injured area. °· Put ice in a plastic bag. °· Place a towel between your skin and the bag. °· Leave the ice on for 15-20 minutes, 03-04 times a day. °· Only take medicine as told by your doctor. °· Rest the injured area. °· If possible, raise (elevate) the injured area to lessen puffiness. °GET HELP RIGHT AWAY IF:  °· You have more bruising or puffiness. °· You have pain that is getting worse. °· Your puffiness or pain is not helped by medicine. °MAKE SURE YOU:  °· Understand these instructions. °· Will watch your condition. °· Will get help right away if you are not doing well or get worse. °Document Released: 03/07/2008 Document Revised: 12/12/2011 Document Reviewed: 07/25/2011 °ExitCare® Patient Information ©2015 [employer]. This information is not intended to replace advice given to you by your health care provider. Make sure you discuss any questions you have with your health care provider. ° °Joint Sprain °A sprain is a tear or stretch in the ligaments that hold a joint together. Severe sprains may need as long as 3-6 weeks of immobilization and/or exercises to heal completely. Sprained joints should be rested and protected. If not, they can become unstable and prone to re-injury. Proper treatment can reduce your pain, shorten the period of disability, and reduce the risk of repeated injuries. °TREATMENT  °· Rest and elevate the injured joint to reduce pain and swelling. °· Apply ice packs to the injury for 20-30 minutes every 2-3 hours for the next 2-3 days. °· Keep the injury wrapped in a compression bandage or splint as long as the joint is painful or as instructed by your caregiver. °· Do not use the injured joint until it is completely healed to  prevent re-injury and chronic instability. Follow the instructions of your caregiver. °· Long-term sprain management may require exercises and/or treatment by a physical therapist. Taping or special braces may help stabilize the joint until it is completely better. °SEEK MEDICAL CARE IF:  °· You develop increased pain or swelling of the joint. °· You develop increasing redness and warmth of the joint. °· You develop a fever. °· It becomes stiff. °· Your hand or foot gets cold or numb. °Document Released: 10/27/2004 Document Revised: 12/12/2011 Document Reviewed: 10/06/2008 °Exit[employer]® Patient Information ©2015 [employer]. This information is not intended to replace advice given to you by your health care provider. Make sure you discuss any questions you have with your health care provider. ° °

## 2015-08-28 ENCOUNTER — Emergency Department: Payer: Self-pay

## 2015-08-28 ENCOUNTER — Emergency Department
Admission: EM | Admit: 2015-08-28 | Discharge: 2015-08-28 | Disposition: A | Discharge status: Home / Self Care | Payer: Self-pay | Attending: Emergency Medicine | Admitting: Emergency Medicine

## 2015-08-28 DIAGNOSIS — F17200 Nicotine dependence, unspecified, uncomplicated: Secondary | ICD-10-CM | POA: Insufficient documentation

## 2015-08-28 DIAGNOSIS — J45909 Unspecified asthma, uncomplicated: Secondary | ICD-10-CM | POA: Insufficient documentation

## 2015-08-28 DIAGNOSIS — J069 Acute upper respiratory infection, unspecified: Secondary | ICD-10-CM | POA: Insufficient documentation

## 2015-08-28 DIAGNOSIS — R51 Headache: Secondary | ICD-10-CM | POA: Insufficient documentation

## 2015-08-28 MED ORDER — PSEUDOEPHEDRINE HCL 30 MG PO TABS: 30.0000 mg | ORAL_TABLET | Freq: Four times a day (QID) | ORAL | Status: AC | PRN

## 2015-08-28 MED ORDER — GUAIFENESIN-CODEINE 100-10 MG/5ML PO SOLN: 10.0000 mL | Freq: Four times a day (QID) | ORAL | Status: AC | PRN

## 2015-08-28 NOTE — ED Provider Notes (Addendum)
[facility] Emergency Department Provider Note     Time seen: ----------------------------------------- 3:56 PM on 08/28/2015 -----------------------------------------    I have reviewed the triage vital signs and the nursing notes.   HISTORY  Chief Complaint URI    HPI Debbie Page is a 39 y.o. female who presents ER for headache, sinus and chest congestion with cough for the last 4 days. Patient denies any fever but did have chills last night, denies nausea vomiting or diarrhea. Patient has some left lower chest and abdominal pain when she coughs.   Past Medical History  Diagnosis Date  . Thyroid disease   . Seizures (HCC)   . Asthma     There are no active problems to display for this patient.   Past Surgical History  Procedure Laterality Date  . Abdominal hysterectomy    . Abdominal surgery    . Cesarean section      x2    Allergies Aspirin and Morphine and related  Social History Social History  Substance Use Topics  . Smoking status: Current Every Day Smoker  . Smokeless tobacco: None  . Alcohol Use: No    Review of Systems Constitutional: Negative for fever. Eyes: Negative for visual changes. ENT: Negative for sore throat. Positive for congestion Cardiovascular: Negative for chest pain. Respiratory: Negative for shortness of breath. Positive for cough Gastrointestinal: Negative for abdominal pain, vomiting and diarrhea. Genitourinary: Negative for dysuria. Musculoskeletal: Negative for back pain. Skin: Negative for rash. Neurological: Negative for headaches, focal weakness or numbness.  10-point ROS otherwise negative.  ____________________________________________   PHYSICAL EXAM:  VITAL SIGNS: ED Triage Vitals  Enc Vitals Group     BP 08/28/15 1501 114/67 mmHg     Pulse Rate 08/28/15 1501 71     Resp 08/28/15 1501 17     Temp 08/28/15 1501 98 F (36.7 C)     Temp Source 08/28/15 1501 Oral     SpO2  08/28/15 1501 97 %     Weight 08/28/15 1501 134 lb (60.782 kg)     Height 08/28/15 1501 5\' 2"  (1.575 m)     Head Cir --      Peak Flow --      Pain Score 08/28/15 1502 6     Pain Loc --      Pain Edu? --      Excl. in GC? --     Constitutional: Alert and oriented. Well appearing and in no distress. Eyes: Conjunctivae are normal. PERRL. Normal extraocular movements. ENT   Head: Normocephalic and atraumatic.   Nose: No congestion/rhinnorhea.   Mouth/Throat: Mucous membranes are moist.   Neck: No stridor. Cardiovascular: Normal rate, regular rhythm. Normal and symmetric distal pulses are present in all extremities. No murmurs, rubs, or gallops. Respiratory: Normal respiratory effort without tachypnea nor retractions. Breath sounds are clear and equal bilaterally. No wheezes/rales/rhonchi. Gastrointestinal: Soft and nontender. No distention. No abdominal bruits.  Musculoskeletal: Nontender with normal range of motion in all extremities. No joint effusions.  No lower extremity tenderness nor edema. Neurologic:  Normal speech and language. No gross focal neurologic deficits are appreciated. Speech is normal. No gait instability. Skin:  Skin is warm, dry and intact. No rash noted. Psychiatric: Mood and affect are normal. Speech and behavior are normal. Patient exhibits appropriate insight and judgment. ___________________________________________  ED COURSE:  Pertinent labs & imaging results that were available during my care of the patient were reviewed by me and considered in my medical  decision making (see chart for details). Patient is no acute distress, will obtain chest x-ray and reevaluate.  RADIOLOGY Images were viewed by me  Chest x-ray is unremarkable  ____________________________________________  FINAL ASSESSMENT AND PLAN  URI  Plan: Patient with labs and imaging as dictated above. Patient given decongestants and cough medications take as needed. She is  stable for outpatient follow-up with her doctor.   Emily Filbert, MD   Emily Filbert, MD 08/28/15 1558  Emily Filbert, MD 08/28/15 [phone]

## 2015-08-28 NOTE — ED Notes (Signed)
Pt c/o HA with sinus and chest congestion, cough for the past 4 days

## 2015-08-28 NOTE — ED Notes (Signed)
Assessment per md

## 2015-08-28 NOTE — Discharge Instructions (Signed)
Upper Respiratory Infection, Adult Most upper respiratory infections (URIs) are a viral infection of the air passages leading to the lungs. A URI affects the nose, throat, and upper air passages. The most common type of URI is nasopharyngitis and is typically referred to as "the common cold." URIs run their course and usually go away on their own. Most of the time, a URI does not require medical attention, but sometimes a bacterial infection in the upper airways can follow a viral infection. This is called a secondary infection. Sinus and middle ear infections are common types of secondary upper respiratory infections. Bacterial pneumonia can also complicate a URI. A URI can worsen asthma and chronic obstructive pulmonary disease (COPD). Sometimes, these complications can require emergency medical care and may be life threatening.  CAUSES Almost all URIs are caused by viruses. A virus is a type of germ and can spread from one person to another.  RISKS FACTORS You may be at risk for a URI if:   You smoke.   You have chronic heart or lung disease.  You have a weakened defense (immune) system.   You are very young or very old.   You have nasal allergies or asthma.  You work in crowded or poorly ventilated areas.  You work in health care facilities or schools. SIGNS AND SYMPTOMS  Symptoms typically develop 2-3 days after you come in contact with a cold virus. Most viral URIs last 7-10 days. However, viral URIs from the influenza virus (flu virus) can last 14-18 days and are typically more severe. Symptoms may include:   Runny or stuffy (congested) nose.   Sneezing.   Cough.   Sore throat.   Headache.   Fatigue.   Fever.   Loss of appetite.   Pain in your forehead, behind your eyes, and over your cheekbones (sinus pain).  Muscle aches.  DIAGNOSIS  Your health care provider may diagnose a URI by:  Physical exam.  Tests to check that your symptoms are not due to  another condition such as:  Strep throat.  Sinusitis.  Pneumonia.  Asthma. TREATMENT  A URI goes away on its own with time. It cannot be cured with medicines, but medicines may be prescribed or recommended to relieve symptoms. Medicines may help:  Reduce your fever.  Reduce your cough.  Relieve nasal congestion. HOME CARE INSTRUCTIONS   Take medicines only as directed by your health care provider.   Gargle warm saltwater or take cough drops to comfort your throat as directed by your health care provider.  Use a warm mist humidifier or inhale steam from a shower to increase air moisture. This may make it easier to breathe.  Drink enough fluid to keep your urine clear or pale yellow.   Eat soups and other clear broths and maintain good nutrition.   Rest as needed.   Return to work when your temperature has returned to normal or as your health care provider advises. You may need to stay home longer to avoid infecting others. You can also use a face mask and careful hand washing to prevent spread of the virus.  Increase the usage of your inhaler if you have asthma.   Do not use any tobacco products, including cigarettes, chewing tobacco, or electronic cigarettes. If you need help quitting, ask your health care provider. PREVENTION  The best way to protect yourself from getting a cold is to practice good hygiene.   Avoid oral or hand contact with people with cold   symptoms.   Wash your hands often if contact occurs.  There is no clear evidence that vitamin C, vitamin E, echinacea, or exercise reduces the chance of developing a cold. However, it is always recommended to get plenty of rest, exercise, and practice good nutrition.  SEEK MEDICAL CARE IF:   You are getting worse rather than better.   Your symptoms are not controlled by medicine.   You have chills.  You have worsening shortness of breath.  You have brown or red mucus.  You have yellow or brown nasal  discharge.  You have pain in your face, especially when you bend forward.  You have a fever.  You have swollen neck glands.  You have pain while swallowing.  You have white areas in the back of your throat. SEEK IMMEDIATE MEDICAL CARE IF:   You have severe or persistent:  Headache.  Ear pain.  Sinus pain.  Chest pain.  You have chronic lung disease and any of the following:  Wheezing.  Prolonged cough.  Coughing up blood.  A change in your usual mucus.  You have a stiff neck.  You have changes in your:  Vision.  Hearing.  Thinking.  Mood. MAKE SURE YOU:   Understand these instructions.  Will watch your condition.  Will get help right away if you are not doing well or get worse.   This information is not intended to replace advice given to you by your health care provider. Make sure you discuss any questions you have with your health care provider.   Document Released: 03/15/2001 Document Revised: 02/03/2015 Document Reviewed: 12/25/2013 Elsevier Interactive Patient Education 2016 [employer]  

## 2015-09-14 ENCOUNTER — Telehealth: Payer: Self-pay | Admitting: Family

## 2015-09-14 DIAGNOSIS — B9689 Other specified bacterial agents as the cause of diseases classified elsewhere: Secondary | ICD-10-CM

## 2015-09-14 DIAGNOSIS — J069 Acute upper respiratory infection, unspecified: Secondary | ICD-10-CM

## 2015-09-14 MED ORDER — BENZONATATE 100 MG PO CAPS: 100.0000 mg | ORAL_CAPSULE | Freq: Two times a day (BID) | ORAL | Status: AC | PRN

## 2015-09-14 MED ORDER — AZITHROMYCIN 250 MG PO TABS: ORAL_TABLET | ORAL | Status: AC

## 2015-09-14 NOTE — Progress Notes (Signed)

## 2016-01-11 ENCOUNTER — Encounter: Payer: Self-pay | Admitting: Emergency Medicine

## 2016-01-11 ENCOUNTER — Emergency Department
Admission: EM | Admit: 2016-01-11 | Discharge: 2016-01-11 | Disposition: A | Discharge status: Home / Self Care | Payer: Self-pay | Attending: Emergency Medicine | Admitting: Emergency Medicine

## 2016-01-11 DIAGNOSIS — Z79899 Other long term (current) drug therapy: Secondary | ICD-10-CM | POA: Insufficient documentation

## 2016-01-11 DIAGNOSIS — Z792 Long term (current) use of antibiotics: Secondary | ICD-10-CM | POA: Insufficient documentation

## 2016-01-11 DIAGNOSIS — J45909 Unspecified asthma, uncomplicated: Secondary | ICD-10-CM | POA: Insufficient documentation

## 2016-01-11 DIAGNOSIS — R42 Dizziness and giddiness: Secondary | ICD-10-CM

## 2016-01-11 DIAGNOSIS — F17200 Nicotine dependence, unspecified, uncomplicated: Secondary | ICD-10-CM | POA: Insufficient documentation

## 2016-01-11 DIAGNOSIS — Z791 Long term (current) use of non-steroidal anti-inflammatories (NSAID): Secondary | ICD-10-CM | POA: Insufficient documentation

## 2016-01-11 DIAGNOSIS — E079 Disorder of thyroid, unspecified: Secondary | ICD-10-CM | POA: Insufficient documentation

## 2016-01-11 DIAGNOSIS — R110 Nausea: Secondary | ICD-10-CM | POA: Insufficient documentation

## 2016-01-11 LAB — BASIC METABOLIC PANEL
ANION GAP: 9 (ref 5–15)
BUN: 17 mg/dL (ref 6–20)
CALCIUM: 6.9 mg/dL — AB (ref 8.9–10.3)
CO2: 21 mmol/L — ABNORMAL LOW (ref 22–32)
Chloride: 107 mmol/L (ref 101–111)
Creatinine, Ser: 0.74 mg/dL (ref 0.44–1.00)
GFR calc Af Amer: >60 mL/min (ref >60)
GLUCOSE: 90 mg/dL (ref 65–99)
POTASSIUM: 3.6 mmol/L (ref 3.5–5.1)
SODIUM: 137 mmol/L (ref 135–145)

## 2016-01-11 LAB — CBC
HEMATOCRIT: 40.4 % (ref 35.0–47.0)
HEMOGLOBIN: 13.8 g/dL (ref 12.0–16.0)
MCH: 32.7 pg (ref 26.0–34.0)
MCHC: 34.3 g/dL (ref 32.0–36.0)
MCV: 95.4 fL (ref 80.0–100.0)
Platelets: 171 10*3/uL (ref 150–440)
RBC: 4.23 MIL/uL (ref 3.80–5.20)
RDW: 13.7 % (ref 11.5–14.5)
WBC: 6.3 10*3/uL (ref 3.6–11.0)

## 2016-01-11 LAB — GLUCOSE, CAPILLARY: GLUCOSE-CAPILLARY: 83 mg/dL (ref 65–99)

## 2016-01-11 MED ORDER — MECLIZINE HCL 25 MG PO TABS: 25.0000 mg | ORAL_TABLET | Freq: Three times a day (TID) | ORAL | Status: AC | PRN

## 2016-01-11 MED ORDER — DIAZEPAM 5 MG PO TABS
5.0000 mg | ORAL_TABLET | Freq: Once | ORAL | Status: AC
  Administered 2016-01-11: 5 mg via ORAL
  Filled 2016-01-11: qty 1

## 2016-01-11 MED ORDER — MECLIZINE HCL 25 MG PO TABS
50.0000 mg | ORAL_TABLET | Freq: Once | ORAL | Status: AC
  Administered 2016-01-11: 50 mg via ORAL
  Filled 2016-01-11 (×2): qty 2

## 2016-01-11 MED ORDER — DIAZEPAM 5 MG PO TABS: 5.0000 mg | ORAL_TABLET | Freq: Three times a day (TID) | ORAL | Status: AC | PRN

## 2016-01-11 NOTE — ED Notes (Signed)
Nausea and dizziness that comes and goest.  Started today at work

## 2016-01-11 NOTE — Discharge Instructions (Signed)

## 2016-01-11 NOTE — ED Provider Notes (Signed)
[facility] Emergency Department Provider Note     Time seen: ----------------------------------------- 2:29 PM on 01/11/2016 -----------------------------------------    I have reviewed the triage vital signs and the nursing notes.   HISTORY  Chief Complaint Dizziness and Nausea    HPI Debbie Page is a 40 y.o. female presents ER for nausea and dizziness that has come and gone since she was at work today several times. Patient describes room spinning sensation with nausea, she's never had a history of this before. She denies any recent illness, change in her medicine or other complaints.   Past Medical History  Diagnosis Date  . Thyroid disease   . Seizures (HCC)   . Asthma     There are no active problems to display for this patient.   Past Surgical History  Procedure Laterality Date  . Abdominal hysterectomy    . Abdominal surgery    . Cesarean section      x2    Allergies Aspirin and Morphine and related  Social History Social History  Substance Use Topics  . Smoking status: Current Every Day Smoker  . Smokeless tobacco: None  . Alcohol Use: No    Review of Systems Constitutional: Negative for fever. Eyes: Negative for visual changes. ENT: Negative for sore throat. Cardiovascular: Negative for chest pain. Respiratory: Negative for shortness of breath. Gastrointestinal: Negative for abdominal pain, positive for nausea Genitourinary: Negative for dysuria. Musculoskeletal: Negative for back pain. Skin: Negative for rash. Neurological: Positive for dizziness  10-point ROS otherwise negative.  ____________________________________________   PHYSICAL EXAM:  VITAL SIGNS: ED Triage Vitals  Enc Vitals Group     BP 01/11/16 1209 109/69 mmHg     Pulse Rate 01/11/16 1209 75     Resp 01/11/16 1209 18     Temp 01/11/16 1209 98.5 F (36.9 C)     Temp Source 01/11/16 1209 Oral     SpO2 01/11/16 1209 98 %     Weight  01/11/16 1209 145 lb (65.772 kg)     Height 01/11/16 1209  (1.575 m)     Head Cir --      Peak Flow --      Pain Score 01/11/16 1156 7     Pain Loc --      Pain Edu? --      Excl. in GC? --    Constitutional: Alert and oriented. Well appearing and in no distress. Eyes: Conjunctivae are normal. PERRL. Normal extraocular movements. ENT   Head: Normocephalic and atraumatic.   Nose: No congestion/rhinnorhea.   Mouth/Throat: Mucous membranes are moist.   Neck: No stridor. Cardiovascular: Normal rate, regular rhythm. No murmurs, rubs, or gallops. Respiratory: Normal respiratory effort without tachypnea nor retractions. Breath sounds are clear and equal bilaterally. No wheezes/rales/rhonchi. Gastrointestinal: Soft and nontender. Normal bowel sounds Musculoskeletal: Nontender with normal range of motion in all extremities. No lower extremity tenderness nor edema. Neurologic:  Normal speech and language. No gross focal neurologic deficits are appreciated.  Skin:  Skin is warm, dry and intact. No rash noted. Psychiatric: Mood and affect are normal. Speech and behavior are normal.  ____________________________________________  EKG: Interpreted by me. Normal sinus rhythm with a rate of 68 bpm, normal PR interval, normal QRS, normal QT interval. Normal axis.  ____________________________________________  ED COURSE:  Pertinent labs & imaging results that were available during my care of the patient were reviewed by me and considered in my medical decision making (see chart for details). Patient  is in no acute distress, will check basic labs and reevaluate. ____________________________________________    LABS (pertinent positives/negatives)  Labs Reviewed  BASIC METABOLIC PANEL - Abnormal; Notable for the following:    CO2 21 (*)    Calcium 6.9 (*)    All other components within normal limits  CBC  GLUCOSE, CAPILLARY  CBG MONITORING, ED    ____________________________________________  FINAL ASSESSMENT AND PLAN  Vertigo, chronic hypocalcemia  Plan: Patient with labs as dictated above. Patient was started on meclizine and Valium. Her labs reveal hypocalcemia which is chronic for her, I advised increasing her calcium at home. She does have follow-up soon with endocrinology. She is stable for discharge.   Emily FilbertWilliams, Jonathan E, MD   Emily FilbertJonathan E Williams, MD 01/11/16 [phone]

## 2016-01-11 NOTE — ED Notes (Signed)
CBG BS 83

## 2016-01-17 ENCOUNTER — Emergency Department
Admission: EM | Admit: 2016-01-17 | Discharge: 2016-01-18 | Disposition: A | Discharge status: Home / Self Care | Payer: Self-pay | Attending: Emergency Medicine | Admitting: Emergency Medicine

## 2016-01-17 ENCOUNTER — Encounter: Payer: Self-pay | Admitting: Emergency Medicine

## 2016-01-17 ENCOUNTER — Emergency Department: Payer: Self-pay

## 2016-01-17 DIAGNOSIS — M545 Low back pain: Secondary | ICD-10-CM | POA: Insufficient documentation

## 2016-01-17 DIAGNOSIS — R569 Unspecified convulsions: Secondary | ICD-10-CM | POA: Insufficient documentation

## 2016-01-17 DIAGNOSIS — J45909 Unspecified asthma, uncomplicated: Secondary | ICD-10-CM | POA: Insufficient documentation

## 2016-01-17 DIAGNOSIS — E079 Disorder of thyroid, unspecified: Secondary | ICD-10-CM | POA: Insufficient documentation

## 2016-01-17 DIAGNOSIS — R109 Unspecified abdominal pain: Secondary | ICD-10-CM | POA: Insufficient documentation

## 2016-01-17 DIAGNOSIS — Z791 Long term (current) use of non-steroidal anti-inflammatories (NSAID): Secondary | ICD-10-CM | POA: Insufficient documentation

## 2016-01-17 DIAGNOSIS — F17200 Nicotine dependence, unspecified, uncomplicated: Secondary | ICD-10-CM | POA: Insufficient documentation

## 2016-01-17 DIAGNOSIS — Z792 Long term (current) use of antibiotics: Secondary | ICD-10-CM | POA: Insufficient documentation

## 2016-01-17 DIAGNOSIS — Z79899 Other long term (current) drug therapy: Secondary | ICD-10-CM | POA: Insufficient documentation

## 2016-01-17 LAB — HEPATIC FUNCTION PANEL
ALK PHOS: 58 U/L (ref 38–126)
ALT: 12 U/L — AB (ref 14–54)
AST: 16 U/L (ref 15–41)
Albumin: 4.6 g/dL (ref 3.5–5.0)
BILIRUBIN DIRECT: 0.1 mg/dL (ref 0.1–0.5)
BILIRUBIN INDIRECT: 0.7 mg/dL (ref 0.3–0.9)
BILIRUBIN TOTAL: 0.8 mg/dL (ref 0.3–1.2)
TOTAL PROTEIN: 7.7 g/dL (ref 6.5–8.1)

## 2016-01-17 LAB — CBC WITH DIFFERENTIAL/PLATELET
Basophils Absolute: 0.1 10*3/uL (ref 0–0.1)
Basophils Relative: 1 %
EOS PCT: 2 %
Eosinophils Absolute: 0.1 10*3/uL (ref 0–0.7)
HCT: 46.7 % (ref 35.0–47.0)
Hemoglobin: 16.1 g/dL — ABNORMAL HIGH (ref 12.0–16.0)
LYMPHS ABS: 0.7 10*3/uL — AB (ref 1.0–3.6)
LYMPHS PCT: 7 %
MCH: 33 pg (ref 26.0–34.0)
MCHC: 34.5 g/dL (ref 32.0–36.0)
MCV: 95.9 fL (ref 80.0–100.0)
MONO ABS: 0.3 10*3/uL (ref 0.2–0.9)
Monocytes Relative: 4 %
Neutro Abs: 8.1 10*3/uL — ABNORMAL HIGH (ref 1.4–6.5)
Neutrophils Relative %: 86 %
PLATELETS: 163 10*3/uL (ref 150–440)
RBC: 4.87 MIL/uL (ref 3.80–5.20)
RDW: 13.4 % (ref 11.5–14.5)
WBC: 9.3 10*3/uL (ref 3.6–11.0)

## 2016-01-17 LAB — URINALYSIS COMPLETE WITH MICROSCOPIC (ARMC ONLY)
BILIRUBIN URINE: NEGATIVE
Bacteria, UA: NONE SEEN
GLUCOSE, UA: NEGATIVE mg/dL
Ketones, ur: NEGATIVE mg/dL
Leukocytes, UA: NEGATIVE
Nitrite: NEGATIVE
PH: 5 (ref 5.0–8.0)
Protein, ur: NEGATIVE mg/dL
Specific Gravity, Urine: 1.049 — ABNORMAL HIGH (ref 1.005–1.030)

## 2016-01-17 LAB — BASIC METABOLIC PANEL
Anion gap: 8 (ref 5–15)
BUN: 14 mg/dL (ref 6–20)
CHLORIDE: 107 mmol/L (ref 101–111)
CO2: 22 mmol/L (ref 22–32)
CREATININE: 0.85 mg/dL (ref 0.44–1.00)
Calcium: 6.9 mg/dL — ABNORMAL LOW (ref 8.9–10.3)
GFR calc Af Amer: >60 mL/min (ref >60)
GLUCOSE: 96 mg/dL (ref 65–99)
POTASSIUM: 4.2 mmol/L (ref 3.5–5.1)
Sodium: 137 mmol/L (ref 135–145)

## 2016-01-17 LAB — PREGNANCY, URINE: Preg Test, Ur: NEGATIVE

## 2016-01-17 LAB — POCT PREGNANCY, URINE: PREG TEST UR: NEGATIVE

## 2016-01-17 LAB — LIPASE, BLOOD: Lipase: 21 U/L (ref 11–51)

## 2016-01-17 MED ORDER — HYDROMORPHONE HCL 1 MG/ML IJ SOLN
0.5000 mg | Freq: Once | INTRAMUSCULAR | Status: AC
  Administered 2016-01-17: 0.5 mg via INTRAVENOUS
  Filled 2016-01-17: qty 1

## 2016-01-17 MED ORDER — SODIUM CHLORIDE 0.9 % IV SOLN
Freq: Once | INTRAVENOUS | Status: AC
  Administered 2016-01-17: 1000 mL via INTRAVENOUS

## 2016-01-17 MED ORDER — SODIUM CHLORIDE 0.9 % IV SOLN
Freq: Once | INTRAVENOUS | Status: AC
  Administered 2016-01-17: 18:00:00 via INTRAVENOUS

## 2016-01-17 MED ORDER — HYDROCODONE-ACETAMINOPHEN 5-325 MG PO TABS: 2.0000 | ORAL_TABLET | Freq: Four times a day (QID) | ORAL | Status: AC | PRN

## 2016-01-17 MED ORDER — IOPAMIDOL (ISOVUE-300) INJECTION 61%
100.0000 mL | Freq: Once | INTRAVENOUS | Status: AC | PRN
  Administered 2016-01-17: 100 mL via INTRAVENOUS

## 2016-01-17 MED ORDER — ONDANSETRON HCL 4 MG/2ML IJ SOLN
4.0000 mg | Freq: Once | INTRAMUSCULAR | Status: AC
  Administered 2016-01-17: 4 mg via INTRAVENOUS
  Filled 2016-01-17: qty 2

## 2016-01-17 MED ORDER — DIATRIZOATE MEGLUMINE & SODIUM 66-10 % PO SOLN
15.0000 mL | Freq: Once | ORAL | Status: AC
  Administered 2016-01-17: 15 mL via ORAL

## 2016-01-17 MED ORDER — ONDANSETRON 4 MG PO TBDP: 4.0000 mg | ORAL_TABLET | Freq: Three times a day (TID) | ORAL | Status: AC | PRN

## 2016-01-17 NOTE — ED Notes (Signed)
States seen Monday for dizziness, awoke tues with flank and abd pain and nausea

## 2016-01-17 NOTE — ED Notes (Signed)
Placed on antivert and valium on monday

## 2016-01-17 NOTE — Discharge Instructions (Signed)
Abdominal Pain, Adult Many things can cause abdominal pain. Usually, abdominal pain is not caused by a disease and will improve without treatment. It can often be observed and treated at home. Your health care provider will do a physical exam and possibly order blood tests and X-rays to help determine the seriousness of your pain. However, in many cases, more time must pass before a clear cause of the pain can be found. Before that point, your health care provider may not know if you need more testing or further treatment. HOME CARE INSTRUCTIONS Monitor your abdominal pain for any changes. The following actions may help to alleviate any discomfort you are experiencing:  Only take over-the-counter or prescription medicines as directed by your health care provider.  Do not take laxatives unless directed to do so by your health care provider.  Try a clear liquid diet (broth, tea, or water) as directed by your health care provider. Slowly move to a bland diet as tolerated. SEEK MEDICAL CARE IF:  You have unexplained abdominal pain.  You have abdominal pain associated with nausea or diarrhea.  You have pain when you urinate or have a bowel movement.  You experience abdominal pain that wakes you in the night.  You have abdominal pain that is worsened or improved by eating food.  You have abdominal pain that is worsened with eating fatty foods.  You have a fever. SEEK IMMEDIATE MEDICAL CARE IF:  Your pain does not go away within 2 hours.  You keep throwing up (vomiting).  Your pain is felt only in portions of the abdomen, such as the right side or the left lower portion of the abdomen.  You pass bloody or black tarry stools. MAKE SURE YOU:  Understand these instructions.  Will watch your condition.  Will get help right away if you are not doing well or get worse.   This information is not intended to replace advice given to you by your health care provider. Make sure you discuss  any questions you have with your health care provider.   Document Released: 06/29/2005 Document Revised: 06/10/2015 Document Reviewed: 05/29/2013 Elsevier Interactive Patient Education [employer]2016 [employer]vier Inc.   Please return for worse pain fever vomiting. I will give you some Zofran if needed for the nausea. One 3 times a day can melt on your tongue. We'll also give you a small number of Vicodin pills 14 times a day if needed for the pain. Please follow-up with your Dr. Phineas Realharles Drew he can refer you to orthopedics. CT scan says she colon is full of liquid stool so I expect he will be developing diarrhea here before long. Please return if that becomes too severe he gets very lightheaded or experienced blood in the diarrhea.

## 2016-01-17 NOTE — ED Provider Notes (Signed)
[facility] Emergency Department Provider Note  ____________________________________________  Time seen: Approximately 11:47 PM  I have reviewed the triage vital signs and the nursing notes.   HISTORY  Chief Complaint No chief complaint on file.  Chief complaint is nausea and lower abdominal pain and back pain  HPI Debbie Page is a 40 y.o. female who is here recently for dizziness and nausea. She now has lower abdominal and low back pain and nausea. She is not running a fever the pain is moderate achy occasionally crampy sitting up makes it worse and the bumps in the road makes it worse. There are no other associated symptoms symptoms she is not vomiting or having diarrhea at this point.   Past Medical History  Diagnosis Date  . Thyroid disease   . Seizures (HCC)   . Asthma     There are no active problems to display for this patient.   Past Surgical History  Procedure Laterality Date  . Abdominal hysterectomy    . Abdominal surgery    . Cesarean section      x2    Current Outpatient Rx  Name  Route  Sig  Dispense  Refill  . azithromycin (ZITHROMAX) 250 MG tablet      Take 2 tablets today then 1 daily times 4 days   6 tablet   0   . benzonatate (TESSALON) 100 MG capsule   Oral   Take 1-2 capsules (100-200 mg total) by mouth 2 (two) times daily as needed for cough.   20 capsule   0   . calcium carbonate (OS-CAL) 1250 (500 CA) MG chewable tablet   Oral   Chew 1 tablet by mouth daily.         . carbamazepine (TEGRETOL XR) 200 MG 12 hr tablet   Oral   Take 200 mg by mouth 2 (two) times daily.         . cyclobenzaprine (FLEXERIL) 10 MG tablet   Oral   Take 1 tablet (10 mg total) by mouth 3 (three) times daily as needed for muscle spasms.   30 tablet   1   . cyclobenzaprine (FLEXERIL) 5 MG tablet   Oral   Take 1 tablet (5 mg total) by mouth every 8 (eight) hours as needed for muscle spasms.   15 tablet   0   .  diazepam (VALIUM) 5 MG tablet   Oral   Take 1 tablet (5 mg total) by mouth every 8 (eight) hours as needed (vertigo).   20 tablet   0   . guaiFENesin-codeine 100-10 MG/5ML syrup   Oral   Take 10 mLs by mouth every 6 (six) hours as needed for cough.   120 mL   0   . HYDROcodone-acetaminophen (NORCO) 5-325 MG tablet   Oral   Take 2 tablets by mouth every 6 (six) hours as needed for moderate pain.   6 tablet   0   . ketorolac (TORADOL) 10 MG tablet   Oral   Take 1 tablet (10 mg total) by mouth every 8 (eight) hours.   15 tablet   0   . levothyroxine (SYNTHROID, LEVOTHROID) 125 MCG tablet   Oral   Take 125 mcg by mouth daily before breakfast.         . meclizine (ANTIVERT) 25 MG tablet   Oral   Take 1 tablet (25 mg total) by mouth 3 (three) times daily as needed for dizziness or nausea.  30 tablet   1   . naproxen (NAPROSYN) 500 MG tablet   Oral   Take 1 tablet (500 mg total) by mouth 2 (two) times daily with a meal.   60 tablet   1   . ondansetron (ZOFRAN ODT) 4 MG disintegrating tablet   Oral   Take 1 tablet (4 mg total) by mouth every 8 (eight) hours as needed for nausea or vomiting.   12 tablet   0   . ondansetron (ZOFRAN) 4 MG tablet   Oral   Take 1 tablet (4 mg total) by mouth every 6 (six) hours as needed for nausea or vomiting.   20 tablet   1   . pseudoephedrine (SUDAFED) 30 MG tablet   Oral   Take 1 tablet (30 mg total) by mouth every 6 (six) hours as needed for congestion.   24 tablet   2   . sulfamethoxazole-trimethoprim (BACTRIM DS) 800-160 MG per tablet   Oral   Take 1 tablet by mouth 2 (two) times daily.   14 tablet   0     Allergies Aspirin and Morphine and related  History reviewed. No pertinent family history.  Social History Social History  Substance Use Topics  . Smoking status: Current Every Day Smoker -- 1.00 packs/day  . Smokeless tobacco: None  . Alcohol Use: No    Review of Systems Constitutional: No  fever/chills Eyes: No visual changes. ENT: No sore throat. Cardiovascular: Denies chest pain. Respiratory: Denies shortness of breath. Gastrointestinal:  abdominal pain.   nausea, no vomiting.  No diarrhea.  No constipation. Genitourinary: Negative for dysuria. Musculoskeletal: Negative for back pain. Skin: Negative for rash. Neurological: Negative for headaches, focal weakness or numbness.  10-point ROS otherwise negative.  ____________________________________________   PHYSICAL EXAM:  VITAL SIGNS: ED Triage Vitals  Enc Vitals Group     BP 01/17/16 1741 97/74 mmHg     Pulse Rate 01/17/16 1741 109     Resp 01/17/16 1741 16     Temp 01/17/16 1741 98.6 F (37 C)     Temp Source 01/17/16 2122 Oral     SpO2 01/17/16 1741 95 %     Weight --      Height --      Head Cir --      Peak Flow --      Pain Score 01/17/16 2122 5     Pain Loc --      Pain Edu? --      Excl. in GC? --     Constitutional: Alert and oriented. Well appearing and in no acute distress. Eyes: Conjunctivae are normal. PERRL. EOMI. Head: Atraumatic. Nose: No congestion/rhinnorhea. Mouth/Throat: Mucous membranes are moist.  Oropharynx non-erythematous. Neck: No stridor.  Cardiovascular: Normal rate, regular rhythm. Grossly normal heart sounds.  Good peripheral circulation. Respiratory: Normal respiratory effort.  No retractions. Lungs CTAB. Gastrointestinal: Soft With lower abdominal pain on palpation especially in the right lower quadrant above the pelvic brim.. No distention. No abdominal bruits. No CVA tenderness. Musculoskeletal: No lower extremity tenderness nor edema.  No joint effusions. Neurologic:  Normal speech and language. No gross focal neurologic deficits are appreciated. No gait instability. Skin:  Skin is warm, dry and intact. No rash noted. Psychiatric: Mood and affect are normal. Speech and behavior are normal.  ____________________________________________   LABS (all labs ordered are  listed, but only abnormal results are displayed)  Labs Reviewed  BASIC METABOLIC PANEL - Abnormal; Notable for the following:  Calcium 6.9 (*)    All other components within normal limits  CBC WITH DIFFERENTIAL/PLATELET - Abnormal; Notable for the following:    Hemoglobin 16.1 (*)    Neutro Abs 8.1 (*)    Lymphs Abs 0.7 (*)    All other components within normal limits  URINALYSIS COMPLETEWITH MICROSCOPIC ([facility] ONLY) - Abnormal; Notable for the following:    Color, Urine STRAW (*)    APPearance CLEAR (*)    Specific Gravity, Urine 1.049 (*)    Hgb urine dipstick 1+ (*)    Squamous Epithelial / LPF 0-5 (*)    All other components within normal limits  HEPATIC FUNCTION PANEL - Abnormal; Notable for the following:    ALT 12 (*)    All other components within normal limits  LIPASE, BLOOD  PREGNANCY, URINE  POCT PREGNANCY, URINE   ____________________________________________  EKG   ____________________________________________  RADIOLOGY  CT shows no acute pathology there 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 ____________________________________________   PROCEDURES   ____________________________________________   INITIAL IMPRESSION / ASSESSMENT AND PLAN / ED COURSE  Pertinent labs & imaging results that were available during my care of the patient were reviewed by me and considered in my medical decision making (see chart for details).   ____________________________________________   FINAL CLINICAL IMPRESSION(S) / ED DIAGNOSES  Final diagnoses:  Low back pain without sciatica, unspecified back pain laterality  Abdominal pain, unspecified abdominal location      Arnaldo NatalPaul F Malinda, MD 01/18/16 [phone]

## 2016-01-18 NOTE — ED Notes (Signed)
Pt. Going home with family. 

## 2016-05-28 ENCOUNTER — Emergency Department
Admission: EM | Admit: 2016-05-28 | Discharge: 2016-05-28 | Disposition: A | Discharge status: Home / Self Care | Payer: 59 | Attending: Emergency Medicine | Admitting: Emergency Medicine

## 2016-05-28 ENCOUNTER — Emergency Department: Payer: 59

## 2016-05-28 ENCOUNTER — Encounter: Payer: Self-pay | Admitting: Emergency Medicine

## 2016-05-28 DIAGNOSIS — I880 Nonspecific mesenteric lymphadenitis: Secondary | ICD-10-CM | POA: Insufficient documentation

## 2016-05-28 DIAGNOSIS — F17200 Nicotine dependence, unspecified, uncomplicated: Secondary | ICD-10-CM | POA: Diagnosis not present

## 2016-05-28 DIAGNOSIS — Z79899 Other long term (current) drug therapy: Secondary | ICD-10-CM | POA: Insufficient documentation

## 2016-05-28 DIAGNOSIS — J45909 Unspecified asthma, uncomplicated: Secondary | ICD-10-CM | POA: Diagnosis not present

## 2016-05-28 DIAGNOSIS — R1031 Right lower quadrant pain: Secondary | ICD-10-CM | POA: Diagnosis present

## 2016-05-28 HISTORY — DX: Hypocalcemia: E83.51

## 2016-05-28 LAB — CBC
HEMATOCRIT: 43.4 % (ref 35.0–47.0)
Hemoglobin: 15.1 g/dL (ref 12.0–16.0)
MCH: 32.2 pg (ref 26.0–34.0)
MCHC: 34.7 g/dL (ref 32.0–36.0)
MCV: 92.8 fL (ref 80.0–100.0)
Platelets: 161 10*3/uL (ref 150–440)
RBC: 4.67 MIL/uL (ref 3.80–5.20)
RDW: 13.4 % (ref 11.5–14.5)
WBC: 8.4 10*3/uL (ref 3.6–11.0)

## 2016-05-28 LAB — COMPREHENSIVE METABOLIC PANEL
ALBUMIN: 4.1 g/dL (ref 3.5–5.0)
ALT: 9 U/L — ABNORMAL LOW (ref 14–54)
AST: 12 U/L — AB (ref 15–41)
Alkaline Phosphatase: 51 U/L (ref 38–126)
Anion gap: 9 (ref 5–15)
BUN: 21 mg/dL — AB (ref 6–20)
CHLORIDE: 111 mmol/L (ref 101–111)
CO2: 19 mmol/L — ABNORMAL LOW (ref 22–32)
Calcium: 6.9 mg/dL — ABNORMAL LOW (ref 8.9–10.3)
Creatinine, Ser: 0.77 mg/dL (ref 0.44–1.00)
GFR calc Af Amer: >60 mL/min (ref >60)
GFR calc non Af Amer: >60 mL/min (ref >60)
GLUCOSE: 83 mg/dL (ref 65–99)
POTASSIUM: 3.6 mmol/L (ref 3.5–5.1)
SODIUM: 139 mmol/L (ref 135–145)
Total Bilirubin: 0.2 mg/dL — ABNORMAL LOW (ref 0.3–1.2)
Total Protein: 7.2 g/dL (ref 6.5–8.1)

## 2016-05-28 LAB — URINALYSIS COMPLETE WITH MICROSCOPIC (ARMC ONLY)
BACTERIA UA: NONE SEEN
BILIRUBIN URINE: NEGATIVE
Glucose, UA: NEGATIVE mg/dL
Ketones, ur: NEGATIVE mg/dL
LEUKOCYTES UA: NEGATIVE
Nitrite: NEGATIVE
PH: 5 (ref 5.0–8.0)
Protein, ur: NEGATIVE mg/dL
Specific Gravity, Urine: 1.014 (ref 1.005–1.030)

## 2016-05-28 LAB — PREGNANCY, URINE: PREG TEST UR: NEGATIVE

## 2016-05-28 LAB — LIPASE, BLOOD: LIPASE: 33 U/L (ref 11–51)

## 2016-05-28 MED ORDER — DIATRIZOATE MEGLUMINE & SODIUM 66-10 % PO SOLN
15.0000 mL | Freq: Once | ORAL | Status: AC
  Administered 2016-05-28: 15 mL via ORAL

## 2016-05-28 MED ORDER — ONDANSETRON HCL 4 MG/2ML IJ SOLN
4.0000 mg | Freq: Once | INTRAMUSCULAR | Status: AC
  Administered 2016-05-28: 4 mg via INTRAVENOUS
  Filled 2016-05-28: qty 2

## 2016-05-28 MED ORDER — IOPAMIDOL (ISOVUE-300) INJECTION 61%
100.0000 mL | Freq: Once | INTRAVENOUS | Status: AC | PRN
  Administered 2016-05-28: 80 mL via INTRAVENOUS

## 2016-05-28 NOTE — ED Triage Notes (Signed)
Low abd pain with bloating since yesterday. Denies constipation. No pain with urination.

## 2016-05-28 NOTE — ED Notes (Signed)
Pt verbalized understanding of discharge instructions. NAD at this time. 

## 2016-05-28 NOTE — ED Notes (Signed)
Patient transported to CT 

## 2016-05-28 NOTE — ED Provider Notes (Signed)
[facility] Emergency Department Provider Note   ____________________________________________   None    (approximate)  I have reviewed the triage vital signs and the nursing notes.   HISTORY  Chief Complaint Abdominal Pain   HPI Debbie Page is a 40 y.o. female patient reports she began having a little bit of lower abdominal pain on Thursday 3 days ago its gradually gotten worse and belly seems to be getting a little more swollen the pain is worse with some movements and worse when she goes over bumps in the road's. She is a little nauseated and has a decrease in her appetite she's not having any fever she is not having any vomiting diarrhea constipation pain is moderate to mild discomfort and achy pain occasionally it is a sharp component to it but not presently   Past Medical History:  Diagnosis Date  . Asthma   . Hypocalcemia   . Seizures (HCC)   . Thyroid disease     There are no active problems to display for this patient.   Past Surgical History:  Procedure Laterality Date  . ABDOMINAL HYSTERECTOMY    . ABDOMINAL SURGERY    . adhesions removed     abd adhesions  . CESAREAN SECTION     x2  Patient's ovaries are still in place  Prior to Admission medications   Medication Sig Start Date End Date Taking? Authorizing Provider  azithromycin (ZITHROMAX) 250 MG tablet Take 2 tablets today then 1 daily times 4 days 09/14/15   Beau Fanny, FNP  benzonatate (TESSALON) 100 MG capsule Take 1-2 capsules (100-200 mg total) by mouth 2 (two) times daily as needed for cough. 09/14/15   Beau Fanny, FNP  calcium carbonate (OS-CAL) 1250 (500 CA) MG chewable tablet Chew 1 tablet by mouth daily.    Historical Provider, MD  carbamazepine (TEGRETOL XR) 200 MG 12 hr tablet Take 200 mg by mouth 2 (two) times daily.    Historical Provider, MD  cyclobenzaprine (FLEXERIL) 10 MG tablet Take 1 tablet (10 mg total) by mouth 3 (three) times daily as needed  for muscle spasms. 04/21/15   Mary-Margaret Daphine Deutscher, FNP  diazepam (VALIUM) 5 MG tablet Take 1 tablet (5 mg total) by mouth every 8 (eight) hours as needed (vertigo). 01/11/16   Emily Filbert, MD  guaiFENesin-codeine 100-10 MG/5ML syrup Take 10 mLs by mouth every 6 (six) hours as needed for cough. 08/28/15   Emily Filbert, MD  HYDROcodone-acetaminophen (NORCO) 5-325 MG tablet Take 2 tablets by mouth every 6 (six) hours as needed for moderate pain. 01/17/16   Arnaldo Natal, MD  ketorolac (TORADOL) 10 MG tablet Take 1 tablet (10 mg total) by mouth every 8 (eight) hours. 02/02/15   Jenise V Bacon Menshew, PA-C  levothyroxine (SYNTHROID, LEVOTHROID) 125 MCG tablet Take 125 mcg by mouth daily before breakfast.    Historical Provider, MD  meclizine (ANTIVERT) 25 MG tablet Take 1 tablet (25 mg total) by mouth 3 (three) times daily as needed for dizziness or nausea. 01/11/16   Emily Filbert, MD  naproxen (NAPROSYN) 500 MG tablet Take 1 tablet (500 mg total) by mouth 2 (two) times daily with a meal. 04/21/15   Mary-Margaret Daphine Deutscher, FNP  ondansetron (ZOFRAN ODT) 4 MG disintegrating tablet Take 1 tablet (4 mg total) by mouth every 8 (eight) hours as needed for nausea or vomiting. 01/17/16   Arnaldo Natal, MD  ondansetron (ZOFRAN) 4 MG tablet Take 1 tablet (  4 mg total) by mouth every 6 (six) hours as needed for nausea or vomiting. 03/25/15   Sharman CheekPhillip Stafford, MD  pseudoephedrine (SUDAFED) 30 MG tablet Take 1 tablet (30 mg total) by mouth every 6 (six) hours as needed for congestion. 08/28/15   Emily FilbertJonathan E Williams, MD  sulfamethoxazole-trimethoprim (BACTRIM DS) 800-160 MG per tablet Take 1 tablet by mouth 2 (two) times daily. 03/25/15   Sharman CheekPhillip Stafford, MD    Allergies Aspirin and Morphine and related  History reviewed. No pertinent family history.  Social History Social History  Substance Use Topics  . Smoking status: Current Every Day Smoker    Packs/day: 1.00  . Smokeless tobacco: Never Used    . Alcohol use No    Review of Systems Constitutional: No fever/chills Eyes: No visual changes. ENT: No sore throat. Cardiovascular: Denies chest pain. Respiratory: Denies shortness of breath. Gastrointestinal: Right lower quadrant abdominal pain.   nausea, no vomiting.  No diarrhea.  No constipation. Genitourinary: Negative for dysuria. Musculoskeletal: Negative for back pain. Skin: Negative for rash. Neurological: Negative for headaches, focal weakness or numbness.  10-point ROS otherwise negative.  ____________________________________________   PHYSICAL EXAM:  VITAL SIGNS: ED Triage Vitals  Enc Vitals Group     BP 05/28/16 1424 118/81     Pulse Rate 05/28/16 1424 80     Resp 05/28/16 1424 16     Temp 05/28/16 1413 97.9 F (36.6 C)     Temp Source 05/28/16 1424 Oral     SpO2 05/28/16 1424 97 %     Weight 05/28/16 1413 136 lb (61.7 kg)     Height 05/28/16 1413 5\' 2"  (1.575 m)     Head Circumference --      Peak Flow --      Pain Score 05/28/16 1413 7     Pain Loc --      Pain Edu? --      Excl. in GC? --     Constitutional: Alert and oriented. Well appearing and in no acute distress. Eyes: Conjunctivae are normal. PERRL. EOMI. Head: Atraumatic. Nose: No congestion/rhinnorhea. Mouth/Throat: Mucous membranes are moist.  Oropharynx non-erythematous. Neck: No stridor. Cardiovascular: Normal rate, regular rhythm. Grossly normal heart sounds.  Good peripheral circulation. Respiratory: Normal respiratory effort.  No retractions. Lungs CTAB. Gastrointestinal: Soft and nontenderExcept for in the right lower quadrant where it is tender to palpation and percussion but only mildly so. No distention. No abdominal bruits. No CVA tenderness. Musculoskeletal: No lower extremity tenderness nor edema.  No joint effusions. Neurologic:  Normal speech and language. No gross focal neurologic deficits are appreciated. No gait instability. Skin:  Skin is warm, dry and intact. No rash  noted. Psychiatric: Mood and affect are normal. Speech and behavior are normal.  ____________________________________________   LABS (all labs ordered are listed, but only abnormal results are displayed)  Labs Reviewed  COMPREHENSIVE METABOLIC PANEL - Abnormal; Notable for the following:       Result Value   CO2 19 (*)    BUN 21 (*)    Calcium 6.9 (*)    AST 12 (*)    ALT 9 (*)    Total Bilirubin 0.2 (*)    All other components within normal limits  URINALYSIS COMPLETEWITH MICROSCOPIC ([facility] ONLY) - Abnormal; Notable for the following:    Color, Urine YELLOW (*)    APPearance HAZY (*)    Hgb urine dipstick 2+ (*)    Squamous Epithelial / LPF 0-5 (*)  All other components within normal limits  LIPASE, BLOOD  CBC  PREGNANCY, URINE   ____________________________________________  EKG   ____________________________________________  RADIOLOGY  CLINICAL DATA:  Umbilical pain for 3 days.  EXAM: CT ABDOMEN AND PELVIS WITH CONTRAST  TECHNIQUE: Multidetector CT imaging of the abdomen and pelvis was performed using the standard protocol following bolus administration of intravenous contrast.  CONTRAST:  80mL ISOVUE-300 IOPAMIDOL (ISOVUE-300) INJECTION 61%  COMPARISON:  01/17/2016  FINDINGS: Lower chest:  No acute findings.  Hepatobiliary: No masses or other significant abnormality.  Pancreas: No mass, inflammatory changes, or other significant abnormality.  Spleen: Within normal limits in size and appearance.  Adrenals/Urinary Tract: No masses identified. No evidence of hydronephrosis.  Stomach/Bowel: No evidence of obstruction, inflammatory process, or abnormal fluid collections. The appendix is normal.  Vascular/Lymphatic: There shotty mesenteric lymph nodes predominantly in right central and lower abdomen, the largest measuring 8 mm in cross-section. No evidence of abdominal aortic aneurysm.  Reproductive: Post hysterectomy. No mass or  other significant abnormality.  Other: No free fluid in the pelvis.  Musculoskeletal:  No suspicious bone lesions identified.  IMPRESSION: No CT abnormalities within the solid abdominal organs.  Nonspecific shotty mesenteric lymph nodes centered in the central and lower right abdomen. These may be reactive or potentially represent mesenteric lymphadenitis.   Electronically Signed   By: Ted Mcalpine M.D.   On: 05/28/2016 17:41 ____________________________________________   PROCEDURES  Procedure(s) performed:   Procedures  Critical Care performed:  ____________________________________________   INITIAL IMPRESSION / ASSESSMENT AND PLAN / ED COURSE  Pertinent labs & imaging results that were available during my care of the patient were reviewed by me and considered in my medical decision making (see chart for details).    Clinical Course     ____________________________________________   FINAL CLINICAL IMPRESSION(S) / ED DIAGNOSES  Final diagnoses:  Mesenteric adenitis      NEW MEDICATIONS STARTED DURING THIS VISIT:  New Prescriptions   No medications on file     Note:  This document was prepared using Dragon voice recognition software and may include unintentional dictation errors.    Arnaldo Natal, MD 05/28/16 Zollie Pee

## 2016-08-02 ENCOUNTER — Emergency Department
Admission: EM | Admit: 2016-08-02 | Discharge: 2016-08-02 | Disposition: A | Discharge status: Home / Self Care | Payer: 59 | Attending: Emergency Medicine | Admitting: Emergency Medicine

## 2016-08-02 ENCOUNTER — Encounter: Payer: Self-pay | Admitting: Emergency Medicine

## 2016-08-02 DIAGNOSIS — E039 Hypothyroidism, unspecified: Secondary | ICD-10-CM | POA: Insufficient documentation

## 2016-08-02 DIAGNOSIS — Z791 Long term (current) use of non-steroidal anti-inflammatories (NSAID): Secondary | ICD-10-CM | POA: Diagnosis not present

## 2016-08-02 DIAGNOSIS — Z792 Long term (current) use of antibiotics: Secondary | ICD-10-CM | POA: Insufficient documentation

## 2016-08-02 DIAGNOSIS — Z79899 Other long term (current) drug therapy: Secondary | ICD-10-CM | POA: Diagnosis not present

## 2016-08-02 DIAGNOSIS — F17200 Nicotine dependence, unspecified, uncomplicated: Secondary | ICD-10-CM | POA: Insufficient documentation

## 2016-08-02 DIAGNOSIS — J45909 Unspecified asthma, uncomplicated: Secondary | ICD-10-CM | POA: Insufficient documentation

## 2016-08-02 DIAGNOSIS — J0100 Acute maxillary sinusitis, unspecified: Secondary | ICD-10-CM | POA: Diagnosis not present

## 2016-08-02 DIAGNOSIS — R05 Cough: Secondary | ICD-10-CM | POA: Diagnosis present

## 2016-08-02 MED ORDER — FLUTICASONE PROPIONATE 50 MCG/ACT NA SUSP: 2.0000 | Freq: Every day | NASAL | 2 refills | Status: AC

## 2016-08-02 MED ORDER — FEXOFENADINE HCL 180 MG PO TABS: 180.0000 mg | ORAL_TABLET | Freq: Every day | ORAL | 0 refills | Status: AC

## 2016-08-02 MED ORDER — AMOXICILLIN 500 MG PO CAPS: 500.0000 mg | ORAL_CAPSULE | Freq: Three times a day (TID) | ORAL | 0 refills | Status: AC

## 2016-08-02 NOTE — ED Triage Notes (Signed)
Pt here for pain around left eye and face, in sinus area.  Started yesterday.

## 2016-08-02 NOTE — ED Provider Notes (Signed)
[facility] Emergency Department Provider Note   ____________________________________________   First MD Initiated Contact with Patient 08/02/16 1225     (approximate)  I have reviewed the triage vital signs and the nursing notes.   HISTORY  Chief Complaint Eye Pain and Facial Pain    HPI Debbie Page is a 40 y.o. female patient complaining of 1 week of left facial pressure, ear pressure and postnasal drainage. Patient states pain increase in the facial area yesterday. Patient states she has a nonproductive cough which she tremors to postnasal drainage. Patient denies any fever or chills associated this complaint. Patient denies any vomiting or diarrhea. Patient has not taken a flu shot this season.   Past Medical History:  Diagnosis Date  . Asthma   . Hypocalcemia   . Seizures (HCC)   . Thyroid disease     There are no active problems to display for this patient.   Past Surgical History:  Procedure Laterality Date  . ABDOMINAL HYSTERECTOMY    . ABDOMINAL SURGERY    . adhesions removed     abd adhesions  . CESAREAN SECTION     x2    Prior to Admission medications   Medication Sig Start Date End Date Taking? Authorizing Provider  amoxicillin (AMOXIL) 500 MG capsule Take 1 capsule (500 mg total) by mouth 3 (three) times daily. 08/02/16   Joni Reiningonald K Edmon Magid, PA-C  azithromycin (ZITHROMAX) 250 MG tablet Take 2 tablets today then 1 daily times 4 days 09/14/15   Beau FannyJohn C Withrow, FNP  benzonatate (TESSALON) 100 MG capsule Take 1-2 capsules (100-200 mg total) by mouth 2 (two) times daily as needed for cough. 09/14/15   Beau FannyJohn C Withrow, FNP  calcium carbonate (OS-CAL) 1250 (500 CA) MG chewable tablet Chew 1 tablet by mouth daily.    Historical Provider, MD  carbamazepine (TEGRETOL XR) 200 MG 12 hr tablet Take 200 mg by mouth 2 (two) times daily.    Historical Provider, MD  cyclobenzaprine (FLEXERIL) 10 MG tablet Take 1 tablet (10 mg total) by  mouth 3 (three) times daily as needed for muscle spasms. 04/21/15   Mary-Margaret Daphine DeutscherMartin, FNP  diazepam (VALIUM) 5 MG tablet Take 1 tablet (5 mg total) by mouth every 8 (eight) hours as needed (vertigo). 01/11/16   Emily FilbertJonathan E Williams, MD  fexofenadine (ALLEGRA) 180 MG tablet Take 1 tablet (180 mg total) by mouth daily. 08/02/16   Joni Reiningonald K Chayce Rullo, PA-C  fluticasone (FLONASE) 50 MCG/ACT nasal spray Place 2 sprays into both nostrils daily. 08/02/16 08/02/17  Joni Reiningonald K Greene Diodato, PA-C  guaiFENesin-codeine 100-10 MG/5ML syrup Take 10 mLs by mouth every 6 (six) hours as needed for cough. 08/28/15   Emily FilbertJonathan E Williams, MD  HYDROcodone-acetaminophen (NORCO) 5-325 MG tablet Take 2 tablets by mouth every 6 (six) hours as needed for moderate pain. 01/17/16   Arnaldo NatalPaul F Malinda, MD  ketorolac (TORADOL) 10 MG tablet Take 1 tablet (10 mg total) by mouth every 8 (eight) hours. 02/02/15   Jenise V Bacon Menshew, PA-C  levothyroxine (SYNTHROID, LEVOTHROID) 125 MCG tablet Take 125 mcg by mouth daily before breakfast.    Historical Provider, MD  meclizine (ANTIVERT) 25 MG tablet Take 1 tablet (25 mg total) by mouth 3 (three) times daily as needed for dizziness or nausea. 01/11/16   Emily FilbertJonathan E Williams, MD  naproxen (NAPROSYN) 500 MG tablet Take 1 tablet (500 mg total) by mouth 2 (two) times daily with a meal. 04/21/15   Mary-Margaret Daphine DeutscherMartin, FNP  ondansetron (ZOFRAN ODT) 4 MG disintegrating tablet Take 1 tablet (4 mg total) by mouth every 8 (eight) hours as needed for nausea or vomiting. 01/17/16   Arnaldo NatalPaul F Malinda, MD  ondansetron (ZOFRAN) 4 MG tablet Take 1 tablet (4 mg total) by mouth every 6 (six) hours as needed for nausea or vomiting. 03/25/15   Sharman CheekPhillip Stafford, MD  pseudoephedrine (SUDAFED) 30 MG tablet Take 1 tablet (30 mg total) by mouth every 6 (six) hours as needed for congestion. 08/28/15   Emily FilbertJonathan E Williams, MD  sulfamethoxazole-trimethoprim (BACTRIM DS) 800-160 MG per tablet Take 1 tablet by mouth 2 (two) times daily.  03/25/15   Sharman CheekPhillip Stafford, MD    Allergies Aspirin and Morphine and related  No family history on file.  Social History Social History  Substance Use Topics  . Smoking status: Current Every Day Smoker    Packs/day: 1.00  . Smokeless tobacco: Never Used  . Alcohol use No    Review of Systems Constitutional: No fever/chills Eyes: No visual changes. ENT: No sore throat.Left facial pain and nasal congestion. Cardiovascular: Denies chest pain. Respiratory: Denies shortness of breath. Nonproductive cough Gastrointestinal: No abdominal pain.  No nausea, no vomiting.  No diarrhea.  No constipation. Genitourinary: Negative for dysuria. Musculoskeletal: Negative for back pain. Skin: Negative for rash. Neurological: Positive for frontal headaches, but denies focal weakness or numbness. History of seizure Endocrine:Hypothyroidism Allergic/Immunilogical: Aspirin and morphine ____________________________________________   PHYSICAL EXAM:  VITAL SIGNS: ED Triage Vitals  Enc Vitals Group     BP 08/02/16 1202 127/80     Pulse Rate 08/02/16 1202 71     Resp 08/02/16 1202 16     Temp 08/02/16 1202 97.7 F (36.5 C)     Temp Source 08/02/16 1202 Oral     SpO2 08/02/16 1202 98 %     Weight 08/02/16 1203 135 lb (61.2 kg)     Height 08/02/16 1203 5\' 2"  (1.575 m)     Head Circumference --      Peak Flow --      Pain Score 08/02/16 1203 6     Pain Loc --      Pain Edu? --      Excl. in GC? --     Constitutional: Alert and oriented. Well appearing and in no acute distress. Eyes: Conjunctivae are normal. PERRL. EOMI. Head: Atraumatic. Nose: Bilateral maxillary guarding. Bilateral edematous nasal turbinates with thick rhinorrhea. Bilateral TMs are edematous but nonerythematous.  Mouth/Throat: Mucous membranes are moist.  Oropharynx non-erythematous. Copious postnasal drainage. Neck: No stridor.  No cervical spine tenderness to palpation. Hematological/Lymphatic/Immunilogical: No  cervical lymphadenopathy. Cardiovascular: Normal rate, regular rhythm. Grossly normal heart sounds.  Good peripheral circulation. Respiratory: Normal respiratory effort.  No retractions. Lungs CTAB. Gastrointestinal: Soft and nontender. No distention. No abdominal bruits. No CVA tenderness. Musculoskeletal: No lower extremity tenderness nor edema.  No joint effusions. Neurologic:  Normal speech and language. No gross focal neurologic deficits are appreciated. No gait instability. Skin:  Skin is warm, dry and intact. No rash noted. Psychiatric: Mood and affect are normal. Speech and behavior are normal.  ____________________________________________   LABS (all labs ordered are listed, but only abnormal results are displayed)  Labs Reviewed - No data to display ____________________________________________  EKG   ____________________________________________  RADIOLOGY   ____________________________________________   PROCEDURES  Procedure(s) performed: None  Procedures  Critical Care performed: No  ____________________________________________   INITIAL IMPRESSION / ASSESSMENT AND PLAN / ED COURSE  Pertinent labs & imaging  results that were available during my care of the patient were reviewed by me and considered in my medical decision making (see chart for details).  Left maxillary sinusitis. Patient given discharge care instructions. Patient given a prescription for amoxicillin, Flonase, and Bromfed DM. Patient advised to follow-up with family doctor if no improvement 3-5 days.  Clinical Course     ____________________________________________   FINAL CLINICAL IMPRESSION(S) / ED DIAGNOSES  Final diagnoses:  Subacute maxillary sinusitis      NEW MEDICATIONS STARTED DURING THIS VISIT:  New Prescriptions   AMOXICILLIN (AMOXIL) 500 MG CAPSULE    Take 1 capsule (500 mg total) by mouth 3 (three) times daily.   FEXOFENADINE (ALLEGRA) 180 MG TABLET    Take 1  tablet (180 mg total) by mouth daily.   FLUTICASONE (FLONASE) 50 MCG/ACT NASAL SPRAY    Place 2 sprays into both nostrils daily.     Note:  This document was prepared using Dragon voice recognition software and may include unintentional dictation errors.    Joni Reining, PA-C 08/02/16 1237    Jeanmarie Plant, MD 08/04/16 [phone]

## 2016-11-04 ENCOUNTER — Emergency Department
Admission: EM | Admit: 2016-11-04 | Discharge: 2016-11-04 | Disposition: A | Discharge status: Home / Self Care | Payer: 59 | Attending: Emergency Medicine | Admitting: Emergency Medicine

## 2016-11-04 ENCOUNTER — Encounter: Payer: Self-pay | Admitting: Medical Oncology

## 2016-11-04 DIAGNOSIS — R110 Nausea: Secondary | ICD-10-CM | POA: Insufficient documentation

## 2016-11-04 DIAGNOSIS — Z79899 Other long term (current) drug therapy: Secondary | ICD-10-CM | POA: Diagnosis not present

## 2016-11-04 DIAGNOSIS — F17200 Nicotine dependence, unspecified, uncomplicated: Secondary | ICD-10-CM | POA: Diagnosis not present

## 2016-11-04 DIAGNOSIS — M546 Pain in thoracic spine: Secondary | ICD-10-CM | POA: Diagnosis not present

## 2016-11-04 DIAGNOSIS — J45909 Unspecified asthma, uncomplicated: Secondary | ICD-10-CM | POA: Diagnosis not present

## 2016-11-04 DIAGNOSIS — G8929 Other chronic pain: Secondary | ICD-10-CM | POA: Diagnosis not present

## 2016-11-04 MED ORDER — ONDANSETRON HCL 4 MG/2ML IJ SOLN
4.0000 mg | Freq: Once | INTRAMUSCULAR | Status: AC
  Administered 2016-11-04: 4 mg via INTRAVENOUS
  Filled 2016-11-04: qty 2

## 2016-11-04 MED ORDER — SODIUM CHLORIDE 0.9 % IV BOLUS (SEPSIS)
1000.0000 mL | Freq: Once | INTRAVENOUS | Status: AC
  Administered 2016-11-04: 1000 mL via INTRAVENOUS

## 2016-11-04 MED ORDER — KETOROLAC TROMETHAMINE 30 MG/ML IJ SOLN
30.0000 mg | Freq: Once | INTRAMUSCULAR | Status: AC
  Administered 2016-11-04: 30 mg via INTRAVENOUS
  Filled 2016-11-04: qty 1

## 2016-11-04 NOTE — ED Triage Notes (Signed)
Pt reports rt lower back pain, denies dysuira, denies injury. Ambulatory and in NAD.

## 2016-11-04 NOTE — ED Provider Notes (Signed)
[facility] Emergency Department Provider Note  ____________________________________________  Time seen: Approximately 2:18 PM  I have reviewed the triage vital signs and the nursing notes.   HISTORY  Chief Complaint Back Pain     HPI Debbie Page is a 41 y.o. female , NAD, presents to the emergency department for evaluation of the right mid back pain and nausea. Patient states she has a chronic history of back pain as well as nausea. Was evaluated in another local emergency department approximately one week ago in which she had full workup to include CT and lab results. Nothing abnormal in that time was found. Patient was treated symptomatically and discharged with medications for such. Patient states that her symptoms have not changed since that encounter. States she was worked up by gastroenterology a few years back for her nausea and again no results found. Patient has not seen her primary care provider in some time to follow-up in regards to these symptoms. She denies any fevers, chills, body aches. No exposure to anyone with flu or GI illnesses. Has had no dysuria, hematuria, increased urinary frequency, urinary hesitancy or urgency. Denies pelvic pain or vaginal discharge. Has not noted any rashes, redness, swelling or bruising. Denies any recent injuries or falls. Denies saddle paresthesias or loss of bowel or bladder control.   Past Medical History:  Diagnosis Date  . Asthma   . Hypocalcemia   . Seizures (HCC)   . Thyroid disease     There are no active problems to display for this patient.   Past Surgical History:  Procedure Laterality Date  . ABDOMINAL HYSTERECTOMY    . ABDOMINAL SURGERY    . adhesions removed     abd adhesions  . CESAREAN SECTION     x2    Prior to Admission medications   Medication Sig Start Date End Date Taking? Authorizing Provider  amoxicillin (AMOXIL) 500 MG capsule Take 1 capsule (500 mg total) by mouth 3  (three) times daily. 08/02/16   Joni Reining, PA-C  azithromycin (ZITHROMAX) 250 MG tablet Take 2 tablets today then 1 daily times 4 days 09/14/15   Beau Fanny, FNP  benzonatate (TESSALON) 100 MG capsule Take 1-2 capsules (100-200 mg total) by mouth 2 (two) times daily as needed for cough. 09/14/15   Beau Fanny, FNP  calcium carbonate (OS-CAL) 1250 (500 CA) MG chewable tablet Chew 1 tablet by mouth daily.    Historical Provider, MD  carbamazepine (TEGRETOL XR) 200 MG 12 hr tablet Take 200 mg by mouth 2 (two) times daily.    Historical Provider, MD  cyclobenzaprine (FLEXERIL) 10 MG tablet Take 1 tablet (10 mg total) by mouth 3 (three) times daily as needed for muscle spasms. 04/21/15   Mary-Margaret Daphine Deutscher, FNP  diazepam (VALIUM) 5 MG tablet Take 1 tablet (5 mg total) by mouth every 8 (eight) hours as needed (vertigo). 01/11/16   Emily Filbert, MD  fexofenadine (ALLEGRA) 180 MG tablet Take 1 tablet (180 mg total) by mouth daily. 08/02/16   Joni Reining, PA-C  fluticasone (FLONASE) 50 MCG/ACT nasal spray Place 2 sprays into both nostrils daily. 08/02/16 08/02/17  Joni Reining, PA-C  guaiFENesin-codeine 100-10 MG/5ML syrup Take 10 mLs by mouth every 6 (six) hours as needed for cough. 08/28/15   Emily Filbert, MD  HYDROcodone-acetaminophen (NORCO) 5-325 MG tablet Take 2 tablets by mouth every 6 (six) hours as needed for moderate pain. 01/17/16   Arnaldo Natal, MD  ketorolac (TORADOL) 10 MG tablet Take 1 tablet (10 mg total) by mouth every 8 (eight) hours. 02/02/15   Jenise V Bacon Menshew, PA-C  levothyroxine (SYNTHROID, LEVOTHROID) 125 MCG tablet Take 125 mcg by mouth daily before breakfast.    Historical Provider, MD  meclizine (ANTIVERT) 25 MG tablet Take 1 tablet (25 mg total) by mouth 3 (three) times daily as needed for dizziness or nausea. 01/11/16   Emily Filbert, MD  naproxen (NAPROSYN) 500 MG tablet Take 1 tablet (500 mg total) by mouth 2 (two) times daily with a meal.  04/21/15   Mary-Margaret Daphine Deutscher, FNP  ondansetron (ZOFRAN ODT) 4 MG disintegrating tablet Take 1 tablet (4 mg total) by mouth every 8 (eight) hours as needed for nausea or vomiting. 01/17/16   Arnaldo Natal, MD  ondansetron (ZOFRAN) 4 MG tablet Take 1 tablet (4 mg total) by mouth every 6 (six) hours as needed for nausea or vomiting. 03/25/15   Sharman Cheek, MD  pseudoephedrine (SUDAFED) 30 MG tablet Take 1 tablet (30 mg total) by mouth every 6 (six) hours as needed for congestion. 08/28/15   Emily Filbert, MD  sulfamethoxazole-trimethoprim (BACTRIM DS) 800-160 MG per tablet Take 1 tablet by mouth 2 (two) times daily. 03/25/15   Sharman Cheek, MD    Allergies Aspirin and Morphine and related  No family history on file.  Social History Social History  Substance Use Topics  . Smoking status: Current Every Day Smoker    Packs/day: 1.00  . Smokeless tobacco: Never Used  . Alcohol use No     Review of Systems  Constitutional: No fever/chills Eyes: No visual changes. Cardiovascular: No chest pain. Respiratory: No shortness of breath. No wheezing.  Gastrointestinal: Positive nausea without vomiting. No abdominal pain. No diarrhea.  No constipation. Genitourinary: Negative for dysuria, hematuria, vaginal discharge, vaginal bleeding, pelvic pain. No urinary hesitancy, urgency or increased frequency. Musculoskeletal: Positive for back pain.  Skin: Negative for rash, redness, swelling, bruising, skin sores. Neurological: Negative for numbness, weakness, tingling. No saddle paresthesias or loss of bowel or bladder control. 10-point ROS otherwise negative.  ____________________________________________   PHYSICAL EXAM:  VITAL SIGNS: ED Triage Vitals  Enc Vitals Group     BP 11/04/16 1403 117/69     Pulse Rate 11/04/16 1403 72     Resp 11/04/16 1403 16     Temp 11/04/16 1403 98.3 F (36.8 C)     Temp Source 11/04/16 1403 Oral     SpO2 11/04/16 1403 98 %     Weight 11/04/16  1404 135 lb (61.2 kg)     Height 11/04/16 1404 5\' 2"  (1.575 m)     Head Circumference --      Peak Flow --      Pain Score 11/04/16 1404 8     Pain Loc --      Pain Edu? --      Excl. in GC? --      Constitutional: Alert and oriented. Well appearing and in no acute distress. Eyes: Conjunctivae are normal.  Head: Atraumatic. Neck: Supple with full range of motion. Hematological/Lymphatic/Immunilogical: No cervical lymphadenopathy. Cardiovascular: Normal rate, regular rhythm. Normal S1 and S2.  Good peripheral circulation. Respiratory: Normal respiratory effort without tachypnea or retractions. Lungs CTAB with breath sounds noted in all lung fields. No wheeze, rhonchi, rales. Gastrointestinal: Soft and nontender without distention or guarding in all quadrants. No rebound or rigidity. No masses. Bowel sounds present and normoactive in all quadrants. Musculoskeletal: Tenderness to  light palpation about the right mid, lateral musculature of the back. No crepitus or bony abnormality. Patient has full range of motion of the spine without pain or difficulty. No lower extremity tenderness nor edema.  No joint effusions. Neurologic:  Normal speech and language. No gross focal neurologic deficits are appreciated.  Skin:  Skin is warm, dry and intact. No rash, redness, swelling, bruising, skin sores noted. Psychiatric: Mood and affect are normal. Speech and behavior are normal. Patient exhibits appropriate insight and judgement.   ____________________________________________   LABS  All labs completed on 10/28/2016 through [facility] health care were reviewed and are noted to be without any significant abnormalities. ____________________________________________  EKG  None ____________________________________________  RADIOLOGY  CT scan completed on 10/28/2016 at [facility] health care was reviewed and without abnormality. ____________________________________________    PROCEDURES  Procedure(s)  performed: None   Procedures   Medications  ondansetron (ZOFRAN) injection 4 mg (not administered)  sodium chloride 0.9 % bolus 1,000 mL (1,000 mLs Intravenous New Bag/Given 11/04/16 1527)  ketorolac (TORADOL) 30 MG/ML injection 30 mg (30 mg Intravenous Given 11/04/16 1527)  ondansetron (ZOFRAN) injection 4 mg (4 mg Intravenous Given 11/04/16 1527)     ____________________________________________   INITIAL IMPRESSION / ASSESSMENT AND PLAN / ED COURSE  Pertinent labs & imaging results that were available during my care of the patient were reviewed by me and considered in my medical decision making (see chart for details).  Clinical Course as of Nov 04 1624  Fri Nov 04, 2016  1447 Long discussion had with the patient regards to her chronic symptoms. Full workup was completed approximately one week ago at another emergency department to include CT scans and lab evaluation. Patient notes she has had no changes in the symptoms that she had at that time. She has chronic nausea in which she thinks worsens due to the back pain. She is amenable with treating her nausea and back pain in this emergency department following up outpatient with specialist as previously suggested.  [JH]  1613 Patient notes no change in pain or nausea. IV fluids have completely. Multiple possibilities of medications offered to the patient but she states nothing helps other then phenergan. Unfortunately the patient states she has been advised by her neurologist to not take the medication due to lowering the threshhold of seizure activity. Will give one more dose of Zofran. Patient has had normal vital signs while in the emergency department. She is agreeable to be discharged with strict follow-up and close follow-up with a primary care provider as well as specialist as discussed.  [JH]    Clinical Course User Index [JH] Tanishi Nault L Cathe Bilger, PA-C    Patient's diagnosis is consistent with Nausea without vomiting and chronic  right-sided thoracic back pain. Patient will be discharged home with instructions to continue medications as prescribed 1 week ago as needed. Patient is to follow up with her primary care provider within the next week for recheck on emergency department visits, chronic nausea and back pain. Patient may also follow-up with gastroenterology or urology for the chronic nausea and flank pain and was given information for referral. Patient is given ED precautions to return to the ED for any worsening or new symptoms.   ____________________________________________  FINAL CLINICAL IMPRESSION(S) / ED DIAGNOSES  Final diagnoses:  Nausea without vomiting  Chronic right-sided thoracic back pain      NEW MEDICATIONS STARTED DURING THIS VISIT:  New Prescriptions   No medications on file  Hope PigeonJami L Lamia Mariner, PA-C 11/04/16 1626    Jeanmarie PlantJames A McShane, MD 11/05/16 [id number]

## 2017-08-09 ENCOUNTER — Other Ambulatory Visit: Payer: Self-pay | Admitting: Physician Assistant

## 2017-08-09 ENCOUNTER — Ambulatory Visit
Admission: RE | Admit: 2017-08-09 | Discharge: 2017-08-09 | Disposition: A | Discharge status: Home / Self Care | Payer: Worker's Compensation | Source: Ambulatory Visit | Attending: Physician Assistant | Admitting: Physician Assistant

## 2017-08-09 DIAGNOSIS — M25562 Pain in left knee: Secondary | ICD-10-CM

## 2017-08-09 DIAGNOSIS — M25561 Pain in right knee: Secondary | ICD-10-CM | POA: Diagnosis not present

## 2017-08-09 DIAGNOSIS — M170 Bilateral primary osteoarthritis of knee: Secondary | ICD-10-CM | POA: Insufficient documentation

## 2020-09-01 ENCOUNTER — Emergency Department
Admission: EM | Admit: 2020-09-01 | Discharge: 2020-09-02 | Disposition: A | Discharge status: Home / Self Care | Payer: Self-pay | Attending: Emergency Medicine | Admitting: Emergency Medicine

## 2020-09-01 ENCOUNTER — Other Ambulatory Visit: Payer: Self-pay

## 2020-09-01 ENCOUNTER — Encounter: Payer: Self-pay | Admitting: *Deleted

## 2020-09-01 DIAGNOSIS — R109 Unspecified abdominal pain: Secondary | ICD-10-CM | POA: Insufficient documentation

## 2020-09-01 DIAGNOSIS — F17200 Nicotine dependence, unspecified, uncomplicated: Secondary | ICD-10-CM | POA: Insufficient documentation

## 2020-09-01 DIAGNOSIS — J45909 Unspecified asthma, uncomplicated: Secondary | ICD-10-CM | POA: Insufficient documentation

## 2020-09-01 DIAGNOSIS — M545 Low back pain, unspecified: Secondary | ICD-10-CM | POA: Insufficient documentation

## 2020-09-01 LAB — COMPREHENSIVE METABOLIC PANEL
ALT: 17 U/L (ref 0–44)
AST: 18 U/L (ref 15–41)
Albumin: 4.2 g/dL (ref 3.5–5.0)
Alkaline Phosphatase: 85 U/L (ref 38–126)
Anion gap: 12 (ref 5–15)
BUN: 17 mg/dL (ref 6–20)
CO2: 26 mmol/L (ref 22–32)
Calcium: 7.7 mg/dL — ABNORMAL LOW (ref 8.9–10.3)
Chloride: 102 mmol/L (ref 98–111)
Creatinine, Ser: 0.79 mg/dL (ref 0.44–1.00)
GFR, Estimated: >60 mL/min (ref >60)
Glucose, Bld: 100 mg/dL — ABNORMAL HIGH (ref 70–99)
Potassium: 3.7 mmol/L (ref 3.5–5.1)
Sodium: 140 mmol/L (ref 135–145)
Total Bilirubin: 0.6 mg/dL (ref 0.3–1.2)
Total Protein: 7.6 g/dL (ref 6.5–8.1)

## 2020-09-01 LAB — CBC
HCT: 44.7 % (ref 36.0–46.0)
Hemoglobin: 15.5 g/dL — ABNORMAL HIGH (ref 12.0–15.0)
MCH: 30.9 pg (ref 26.0–34.0)
MCHC: 34.7 g/dL (ref 30.0–36.0)
MCV: 89 fL (ref 80.0–100.0)
Platelets: 228 10*3/uL (ref 150–400)
RBC: 5.02 MIL/uL (ref 3.87–5.11)
RDW: 13.4 % (ref 11.5–15.5)
WBC: 11.7 10*3/uL — ABNORMAL HIGH (ref 4.0–10.5)
nRBC: 0 % (ref 0.0–0.2)

## 2020-09-01 LAB — URINALYSIS, COMPLETE (UACMP) WITH MICROSCOPIC
Bilirubin Urine: NEGATIVE
Glucose, UA: NEGATIVE mg/dL
Ketones, ur: NEGATIVE mg/dL
Leukocytes,Ua: NEGATIVE
Nitrite: NEGATIVE
Protein, ur: NEGATIVE mg/dL
Specific Gravity, Urine: 1.006 (ref 1.005–1.030)
pH: 7 (ref 5.0–8.0)

## 2020-09-01 NOTE — ED Triage Notes (Signed)
Pt has right flank pain with nausea.  Sx for 3 days.  No hx kidney stones.  Denies urinary sx.  Pt rocking back and forth in triage.  Pt alert  Speech clear.

## 2020-09-02 ENCOUNTER — Encounter: Payer: Self-pay | Admitting: Emergency Medicine

## 2020-09-02 ENCOUNTER — Emergency Department: Payer: Self-pay

## 2020-09-02 MED ORDER — CYCLOBENZAPRINE HCL 5 MG PO TABS: 5.0000 mg | ORAL_TABLET | Freq: Three times a day (TID) | ORAL | 0 refills | Status: AC | PRN

## 2020-09-02 MED ORDER — IOHEXOL 300 MG/ML  SOLN
100.0000 mL | Freq: Once | INTRAMUSCULAR | Status: AC | PRN
  Administered 2020-09-02: 100 mL via INTRAVENOUS

## 2020-09-02 MED ORDER — LIDOCAINE 5 % EX PTCH: 1.0000 | MEDICATED_PATCH | Freq: Two times a day (BID) | CUTANEOUS | 0 refills | Status: AC

## 2020-09-02 MED ORDER — KETOROLAC TROMETHAMINE 30 MG/ML IJ SOLN
15.0000 mg | Freq: Once | INTRAMUSCULAR | Status: AC
  Administered 2020-09-02: 15 mg via INTRAVENOUS
  Filled 2020-09-02: qty 1

## 2020-09-02 MED ORDER — FENTANYL CITRATE (PF) 100 MCG/2ML IJ SOLN
50.0000 ug | Freq: Once | INTRAMUSCULAR | Status: AC
  Administered 2020-09-02: 50 ug via INTRAVENOUS
  Filled 2020-09-02: qty 2

## 2020-09-02 MED ORDER — MORPHINE SULFATE (PF) 4 MG/ML IV SOLN: 4.0000 mg | Freq: Once | INTRAVENOUS | Status: DC

## 2020-09-02 MED ORDER — LIDOCAINE 5 % EX PTCH
1.0000 | MEDICATED_PATCH | CUTANEOUS | Status: DC
  Administered 2020-09-02: 1 via TRANSDERMAL
  Filled 2020-09-02: qty 1

## 2020-09-02 MED ORDER — DROPERIDOL 2.5 MG/ML IJ SOLN
1.2500 mg | Freq: Once | INTRAMUSCULAR | Status: AC
  Administered 2020-09-02: 1.25 mg via INTRAVENOUS

## 2020-09-02 MED ORDER — FENTANYL CITRATE (PF) 100 MCG/2ML IJ SOLN: 25.0000 ug | Freq: Once | INTRAMUSCULAR | Status: DC

## 2020-09-02 NOTE — ED Provider Notes (Signed)
[facility] Emergency Department Provider Note  ____________________________________________   First MD Initiated Contact with Patient 09/02/20 0009     (approximate)  I have reviewed the triage vital signs and the nursing notes.   HISTORY  Chief Complaint Flank Pain    HPI Debbie Page is a 44 y.o. female with a complicated history of low back problems and multiple surgeries  with some chronic neuropathy.  She presents for evaluation of 3 to 4 days of pain in the right side of her lower back and flank that occasionally radiates to her abdomen.  The pain is episodic but always comes back.  It is severe, sharp, as well as aching and "pressure".  It is accompanied with nausea but no vomiting.  She has had no dysuria, increased urinary frequency, nor hematuria.  She has no urinary retention.  Because of her history with back surgeries, she is aware of the term "saddle anesthesia" and she reports no numbness nor tingling that is new over her chronic lower extremity neuropathy and she denies urinary incontinence, urinary retention, and any bowel incontinence.  She is able to ambulate and finds that she cannot find a position of comfort when she is having this episodic pain.  She denies fever/chills, sore throat, chest pain, shortness of breath.  She has no history of trauma.  Nothing in particular makes the symptoms better nor worse.        Past Medical History:  Diagnosis Date  . Asthma   . Hypocalcemia   . Seizures (HCC)   . Thyroid disease     There are no problems to display for this patient.   Past Surgical History:  Procedure Laterality Date  . ABDOMINAL HYSTERECTOMY    . ABDOMINAL SURGERY    . adhesions removed     abd adhesions  . BACK SURGERY    . CESAREAN SECTION     x2    Prior to Admission medications   Medication Sig Start Date End Date Taking? Authorizing Provider  amoxicillin (AMOXIL) 500 MG capsule Take 1 capsule (500 mg  total) by mouth 3 (three) times daily. 08/02/16   Joni Reining, PA-C  azithromycin (ZITHROMAX) 250 MG tablet Take 2 tablets today then 1 daily times 4 days 09/14/15   Withrow, Everardo All, FNP  benzonatate (TESSALON) 100 MG capsule Take 1-2 capsules (100-200 mg total) by mouth 2 (two) times daily as needed for cough. 09/14/15   Withrow, Everardo All, FNP  calcium carbonate (OS-CAL) 1250 (500 CA) MG chewable tablet Chew 1 tablet by mouth daily.    [provider]  carbamazepine (TEGRETOL XR) 200 MG 12 hr tablet Take 200 mg by mouth 2 (two) times daily.    [provider]  cyclobenzaprine (FLEXERIL) 5 MG tablet Take 1 tablet (5 mg total) by mouth 3 (three) times daily as needed for muscle spasms. 09/02/20   Loleta Rose, MD  diazepam (VALIUM) 5 MG tablet Take 1 tablet (5 mg total) by mouth every 8 (eight) hours as needed (vertigo). 01/11/16   Emily Filbert, MD  fexofenadine (ALLEGRA) 180 MG tablet Take 1 tablet (180 mg total) by mouth daily. 08/02/16   Joni Reining, PA-C  fluticasone (FLONASE) 50 MCG/ACT nasal spray Place 2 sprays into both nostrils daily. 08/02/16 08/02/17  Joni Reining, PA-C  guaiFENesin-codeine 100-10 MG/5ML syrup Take 10 mLs by mouth every 6 (six) hours as needed for cough. 08/28/15   Emily Filbert, MD  HYDROcodone-acetaminophen (  NORCO) 5-325 MG tablet Take 2 tablets by mouth every 6 (six) hours as needed for moderate pain. 01/17/16   Arnaldo Natal, MD  ketorolac (TORADOL) 10 MG tablet Take 1 tablet (10 mg total) by mouth every 8 (eight) hours. 02/02/15   Menshew, Charlesetta Ivory, PA-C  levothyroxine (SYNTHROID, LEVOTHROID) 125 MCG tablet Take 125 mcg by mouth daily before breakfast.    [provider]  lidocaine (LIDODERM) 5 % Place 1 patch onto the skin every 12 (twelve) hours. Remove & Discard patch within 12 hours or as directed by MD.  Wynelle Fanny the patch off for 12 hours before applying a new one. 09/02/20 09/02/21  Loleta Rose, MD  meclizine  (ANTIVERT) 25 MG tablet Take 1 tablet (25 mg total) by mouth 3 (three) times daily as needed for dizziness or nausea. 01/11/16   Emily Filbert, MD  naproxen (NAPROSYN) 500 MG tablet Take 1 tablet (500 mg total) by mouth 2 (two) times daily with a meal. 04/21/15   Daphine Deutscher, Mary-Margaret, FNP  ondansetron (ZOFRAN ODT) 4 MG disintegrating tablet Take 1 tablet (4 mg total) by mouth every 8 (eight) hours as needed for nausea or vomiting. 01/17/16   Arnaldo Natal, MD  ondansetron (ZOFRAN) 4 MG tablet Take 1 tablet (4 mg total) by mouth every 6 (six) hours as needed for nausea or vomiting. 03/25/15   Sharman Cheek, MD  pseudoephedrine (SUDAFED) 30 MG tablet Take 1 tablet (30 mg total) by mouth every 6 (six) hours as needed for congestion. 08/28/15   Emily Filbert, MD  sulfamethoxazole-trimethoprim (BACTRIM DS) 800-160 MG per tablet Take 1 tablet by mouth 2 (two) times daily. 03/25/15   Sharman Cheek, MD    Allergies Aspirin and Morphine and related  History reviewed. No pertinent family history.  Social History Social History   Tobacco Use  . Smoking status: Current Every Day Smoker    Packs/day: 1.00  . Smokeless tobacco: Never Used  Substance Use Topics  . Alcohol use: No  . Drug use: No    Review of Systems Constitutional: No fever/chills Eyes: No visual changes. ENT: No sore throat. Cardiovascular: Denies chest pain. Respiratory: Denies shortness of breath. Gastrointestinal: Low back/flank pain more noticeable on the right side that occasionally radiates to the front.  Nausea, no vomiting. Genitourinary: Negative for dysuria.  Negative for urinary retention and urinary incontinence. Musculoskeletal: Low back/flank pain more noticeable on the right side that occasionally radiates to the front. Integumentary: Negative for rash. Neurological: Negative for headaches, focal weakness or numbness.   ____________________________________________   PHYSICAL  EXAM:  VITAL SIGNS: ED Triage Vitals  Enc Vitals Group     BP 09/01/20 2002 128/77     Pulse Rate 09/01/20 2002 77     Resp 09/01/20 2002 18     Temp 09/01/20 2002 98.9 F (37.2 C)     Temp Source 09/01/20 2002 Oral     SpO2 09/01/20 2002 97 %     Weight 09/01/20 2000 68.5 kg (151 lb)     Height 09/01/20 2000 1.575 m (5\' 2" )     Head Circumference --      Peak Flow --      Pain Score 09/01/20 2000 7     Pain Loc --      Pain Edu? --      Excl. in GC? --     Constitutional: Alert and oriented.  Eyes: Conjunctivae are normal.  Head: Atraumatic. Nose: No congestion/rhinnorhea. Mouth/Throat: Patient  is wearing a mask. Neck: No stridor.  No meningeal signs.   Cardiovascular: Normal rate, regular rhythm. Good peripheral circulation. Grossly normal heart sounds. Respiratory: Normal respiratory effort.  No retractions. Gastrointestinal: Soft and nontender. No distention.  Musculoskeletal: Tenderness to palpation and percussion to the right of midline of her lumbar spine but no obvious focal tenderness to palpation of the lumbar spine itself.  She does have CVA tenderness to percussion on the right. Neurologic:  Normal speech and language. No gross focal neurologic deficits are appreciated.  Skin:  Skin is warm, dry and intact. Psychiatric: Mood and affect are normal. Speech and behavior are normal.  ____________________________________________   LABS (all labs ordered are listed, but only abnormal results are displayed)  Labs Reviewed  COMPREHENSIVE METABOLIC PANEL - Abnormal; Notable for the following components:      Result Value   Glucose, Bld 100 (*)    Calcium 7.7 (*)    All other components within normal limits  CBC - Abnormal; Notable for the following components:   WBC 11.7 (*)    Hemoglobin 15.5 (*)    All other components within normal limits  URINALYSIS, COMPLETE (UACMP) WITH MICROSCOPIC - Abnormal; Notable for the following components:   Color, Urine YELLOW (*)     APPearance HAZY (*)    Hgb urine dipstick SMALL (*)    Bacteria, UA RARE (*)    All other components within normal limits   ____________________________________________  EKG  No indication for emergent EKG ____________________________________________  RADIOLOGY Marylou Mccoy, personally viewed and evaluated these images (plain radiographs) as part of my medical decision making, as well as reviewing the written report by the radiologist.  ED MD interpretation: No acute abnormalities identified on CT abd/pelvis and L-spine  Official radiology report(s): CT ABDOMEN PELVIS W CONTRAST  Result Date: 09/02/2020 CLINICAL DATA:  Right-sided flank pain for several days EXAM: CT ABDOMEN AND PELVIS WITH CONTRAST TECHNIQUE: Multidetector CT imaging of the abdomen and pelvis was performed using the standard protocol following bolus administration of intravenous contrast. CONTRAST:  OMNIPAQUE IOHEXOL 300 MG/ML  SOLN COMPARISON:  05/28/2016 FINDINGS: Lower chest: No acute abnormality. Hepatobiliary: Fatty infiltration of the liver is noted. The gallbladder is within normal limits. Pancreas: Unremarkable. No pancreatic ductal dilatation or surrounding inflammatory changes. Spleen: Normal in size without focal abnormality. Adrenals/Urinary Tract: Adrenal glands are within normal limits. Kidneys demonstrate a normal enhancement pattern. No renal calculi or obstructive changes are noted. Normal excretion of contrast is noted bilaterally. The bladder is well distended. Stomach/Bowel: No obstructive or inflammatory changes of the colon are seen. The appendix is within normal limits. No small bowel or gastric abnormality is noted. Vascular/Lymphatic: Aortic atherosclerosis. No enlarged abdominal or pelvic lymph nodes. Reproductive: Status post hysterectomy. No adnexal masses. Other: No abdominal wall hernia or abnormality. No abdominopelvic ascites. Musculoskeletal: Postsurgical changes in the lower lumbar  spine are noted. Stable anterolisthesis of L5 on S1 is noted. IMPRESSION: Fatty liver. No other focal abnormality is noted. Electronically Signed   By: Alcide Clever M.D.   On: 09/02/2020 01:34   CT L-SPINE NO CHARGE  Result Date: 09/02/2020 CLINICAL DATA:  Right flank pain and nausea EXAM: CT LUMBAR SPINE WITHOUT CONTRAST TECHNIQUE: Multidetector CT imaging of the lumbar spine was performed without intravenous contrast administration. Multiplanar CT image reconstructions were also generated. COMPARISON:  None. FINDINGS: Segmentation: Standard Alignment: Grade 1 anterolisthesis at L5-S1 Vertebrae: L5-S1 PLIF.  No acute abnormality. Paraspinal and other soft tissues: Negative. Disc  levels: The levels above L5 are normal. At L5-S1, there is moderate right foraminal stenosis caused by the anterolisthesis. IMPRESSION: 1. Grade 1 anterolisthesis at L5-S1 with moderate right foraminal stenosis. 2. No acute abnormality of the lumbar spine. Electronically Signed   By: Deatra Robinson M.D.   On: 09/02/2020 01:42    ____________________________________________   PROCEDURES   Procedure(s) performed (including Critical Care):  Procedures   ____________________________________________   INITIAL IMPRESSION / MDM / ASSESSMENT AND PLAN / ED COURSE  As part of my medical decision making, I reviewed the following data within the electronic MEDICAL RECORD NUMBER Nursing notes reviewed and incorporated, Labs reviewed , Old chart reviewed, Notes from prior ED visits and [place] Controlled Substance Database   Differential diagnosis includes, but is not limited to, renal colic, UTI/pyelonephritis, lumbar strain, discitis, osteomyelitis, transverse myelitis, epidural abscess.  The patient appears relatively comfortable at this time but she reports that when the pain happens it is very severe.  Renal/ureteral colic makes the most sense, but given her complicated history of back issues, I think it is reasonable to obtain a CT  of the abdomen and pelvis with IV contrast for optimal imaging and obtain lumbar recons as well.  Her vital signs are stable, minimal leukocytosis of 11.7, essentially normal comprehensive metabolic panel, and urinalysis is mostly normal other than a small amount of hemoglobin which could be indicative of renal/ureteral colic.  Patient understands and agrees with the plan for imaging.  For discomfort I am also giving Toradol 15 mg IV, morphine 4 mg IV, and droperidol 1.25 mg IV for its antiemetic properties as well as an adjunct to the analgesia.  I reviewed the [place] controlled substance database and see that she has been on chronic pain medicine in the past but not had any prescriptions for about a year.     Clinical Course as of Sep 02 253  Wed Sep 02, 2020  0245 No acute abnormalities identified on CT of the abdomen pelvis nor on the L-spine recons.  Patient has been sleeping comfortably.  I updated her and explained I think this is most likely musculoskeletal lumbar pain in the setting of some chronic issues.  We agreed on the plan for no opioids and instead will try prescriptions as listed below.  She understands and agrees with the plan and will follow up as an outpatient with her doctor.  I gave my usual and customary return precautions.  CT ABDOMEN PELVIS W CONTRAST [CF]    Clinical Course User Index [CF] Loleta Rose, MD     ____________________________________________  FINAL CLINICAL IMPRESSION(S) / ED DIAGNOSES  Final diagnoses:  Low back pain     MEDICATIONS GIVEN DURING THIS VISIT:  Medications  lidocaine (LIDODERM) 5 % 1 patch (has no administration in time range)  ketorolac (TORADOL) 30 MG/ML injection 15 mg (15 mg Intravenous Given 09/02/20 0042)  droperidol (INAPSINE) 2.5 MG/ML injection 1.25 mg (1.25 mg Intravenous Given 09/02/20 0042)  fentaNYL (SUBLIMAZE) injection 50 mcg (50 mcg Intravenous Given 09/02/20 0043)  iohexol (OMNIPAQUE) 300 MG/ML solution 100  mL (100 mLs Intravenous Contrast Given 09/02/20 0116)     ED Discharge Orders         Ordered    lidocaine (LIDODERM) 5 %  Every 12 hours        09/02/20 0249    cyclobenzaprine (FLEXERIL) 5 MG tablet  3 times daily PRN        09/02/20 0249          *  Please note:  Debbie Page was evaluated in Emergency Department on 09/02/2020 for the symptoms described in the history of present illness. She was evaluated in the context of the global COVID-19 pandemic, which necessitated consideration that the patient might be at risk for infection with the SARS-CoV-2 virus that causes COVID-19. Institutional protocols and algorithms that pertain to the evaluation of patients at risk for COVID-19 are in a state of rapid change based on information released by regulatory bodies including the CDC and federal and state organizations. These policies and algorithms were followed during the patient's care in the ED.  Some ED evaluations and interventions may be delayed as a result of limited staffing during and after the pandemic.*  Note:  This document was prepared using Dragon voice recognition software and may include unintentional dictation errors.   Loleta RoseForbach, Vickie Ponds, MD 09/02/20 [phone]

## 2020-09-02 NOTE — ED Notes (Signed)
Pt sleeping. 

## 2020-09-02 NOTE — ED Notes (Signed)
Report off to gracie  rn  

## 2020-09-02 NOTE — Discharge Instructions (Signed)
You were evaluated in the Emergency Department today for back pain. Your evaluation suggests no acute abnormalities which require further intervention at this time.   - Move around as tolerated but avoiding heavy lifting. "Bed rest" is not recommended nor is it the best treatment for low back pain.  - Medications will help control your discomfort: -- Ibuprofen (800 mg every 8 hours for pain). -- Any other prescriptions you were provided as per label instructions -- Do not drink alcohol, drive a car, operate machinery, or get up on ladders or heights when taking any prescribed pain medications. -- Do not drive home if you received prescribed pain medications here in the ED.  Please follow up with your primary care physician as needed or any other providers listed in this paperwork. If you do not have a primary doctor, you can call your insurance company to find one.  If you do not have insurance, you can go to the finance/registration department for more assistance.  Return to the ED immediately if you develop any of the following problems: -- Leaking urine or difficulty urinating; -- Inability to control your bowels; -- New numbness or weakness in your legs or numbness between your legs; -- Inability to walk -- Fever 

## 2020-10-04 ENCOUNTER — Emergency Department
Admission: EM | Admit: 2020-10-04 | Discharge: 2020-10-04 | Disposition: A | Discharge status: Home / Self Care | Payer: Self-pay | Attending: Emergency Medicine | Admitting: Emergency Medicine

## 2020-10-04 ENCOUNTER — Other Ambulatory Visit: Payer: Self-pay

## 2020-10-04 ENCOUNTER — Encounter: Payer: Self-pay | Admitting: Emergency Medicine

## 2020-10-04 DIAGNOSIS — Z5321 Procedure and treatment not carried out due to patient leaving prior to being seen by health care provider: Secondary | ICD-10-CM | POA: Insufficient documentation

## 2020-10-04 DIAGNOSIS — R569 Unspecified convulsions: Secondary | ICD-10-CM | POA: Insufficient documentation

## 2020-10-04 LAB — CBC
HCT: 41.1 % (ref 36.0–46.0)
Hemoglobin: 14.1 g/dL (ref 12.0–15.0)
MCH: 30.7 pg (ref 26.0–34.0)
MCHC: 34.3 g/dL (ref 30.0–36.0)
MCV: 89.5 fL (ref 80.0–100.0)
Platelets: 191 10*3/uL (ref 150–400)
RBC: 4.59 MIL/uL (ref 3.87–5.11)
RDW: 14.2 % (ref 11.5–15.5)
WBC: 9.4 10*3/uL (ref 4.0–10.5)
nRBC: 0 % (ref 0.0–0.2)

## 2020-10-04 LAB — BASIC METABOLIC PANEL
Anion gap: 11 (ref 5–15)
BUN: 19 mg/dL (ref 6–20)
CO2: 24 mmol/L (ref 22–32)
Calcium: 7.3 mg/dL — ABNORMAL LOW (ref 8.9–10.3)
Chloride: 103 mmol/L (ref 98–111)
Creatinine, Ser: 0.79 mg/dL (ref 0.44–1.00)
GFR, Estimated: >60 mL/min (ref >60)
Glucose, Bld: 89 mg/dL (ref 70–99)
Potassium: 4.1 mmol/L (ref 3.5–5.1)
Sodium: 138 mmol/L (ref 135–145)

## 2020-10-04 NOTE — ED Triage Notes (Signed)
Pt to ED via [facility] from home for seizure. Pt states that she has hx/o same. Pt has been off of her medication x 2 years. Pt states that this is her second seizure in the last month. Pt states that her neurologist is at [facility]. Pt last saw neuro a while ago. Pt denies ETOH use.

## 2020-10-04 NOTE — ED Notes (Signed)
Pt leaving to go to [facility], husband here to take her. IV removed from EMS.

## 2020-10-04 NOTE — ED Triage Notes (Signed)
Pt in via EMS from home with c/o seizure. EMS was posti ictal upon EMS arrival. HR 118, #20g to right AC. Pt with hx of seizures

## 2022-02-08 IMAGING — CT CT ABD-PELV W/ CM
2 of 5 series · 17 of 46 positions shown, 19 images · IV contrast (APPLIED)
Comparison: 05/28/2016

CLINICAL DATA: Right-sided flank pain for several days

EXAM:
CT ABDOMEN AND PELVIS WITH CONTRAST
TECHNIQUE: Multidetector CT imaging of the abdomen and pelvis was performed
using the standard protocol following bolus administration of
intravenous contrast.
CONTRAST:  100mL OMNIPAQUE IOHEXOL 300 MG/ML  SOLN

[Series 2: axial st · axial · 0.80mm/px · z∈[-782,-387]mm · 14 of 89 slices shown, 16 images]
[im 5/89  soft-tissue]
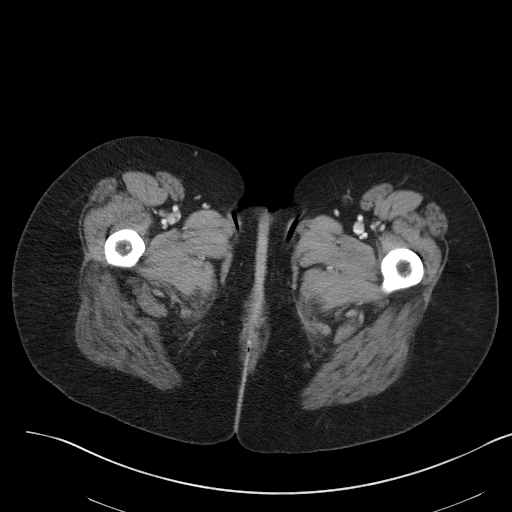
[im 5/89  bone]
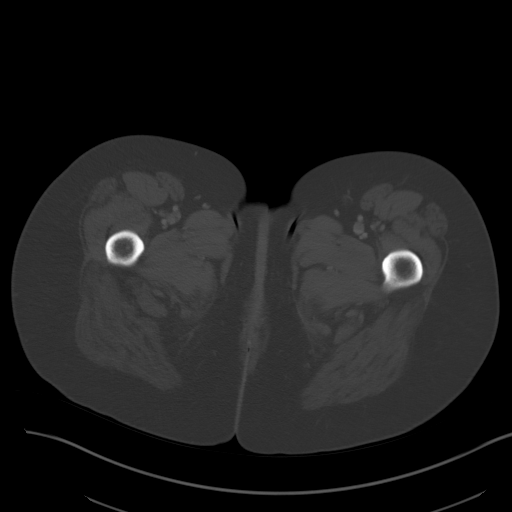
[im 14/89  soft-tissue]
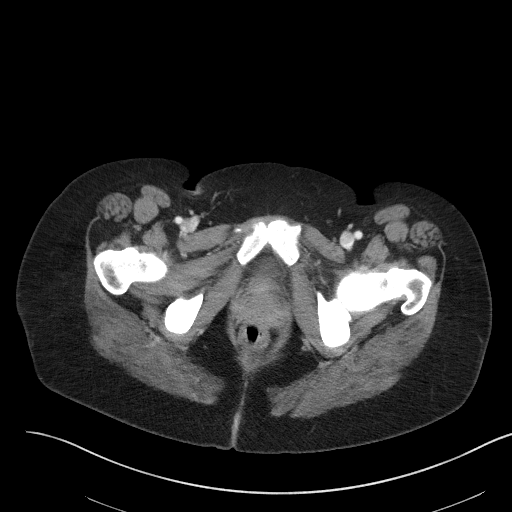
[im 18/89  soft-tissue]
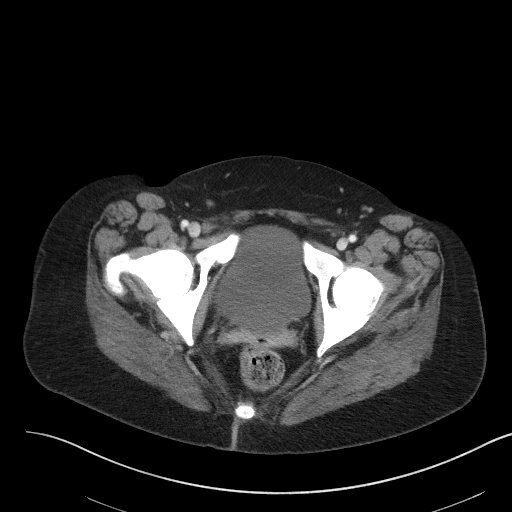
[im 23/89  soft-tissue]
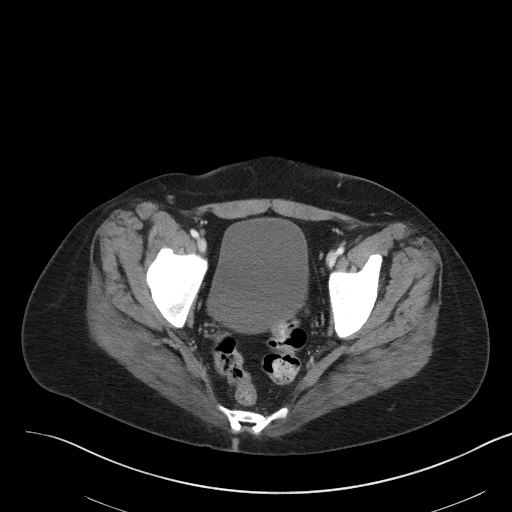
[im 31/89  soft-tissue]
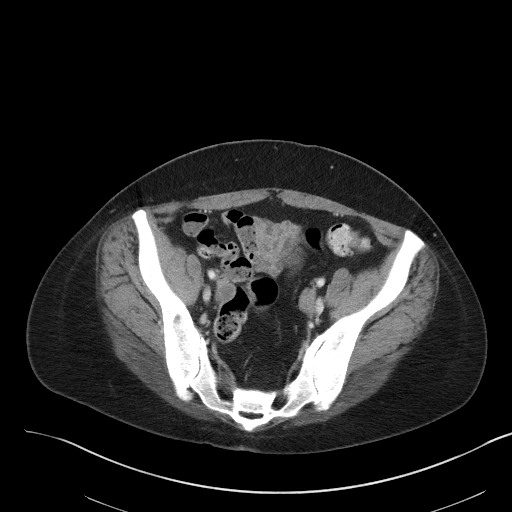
[im 36/89  soft-tissue]
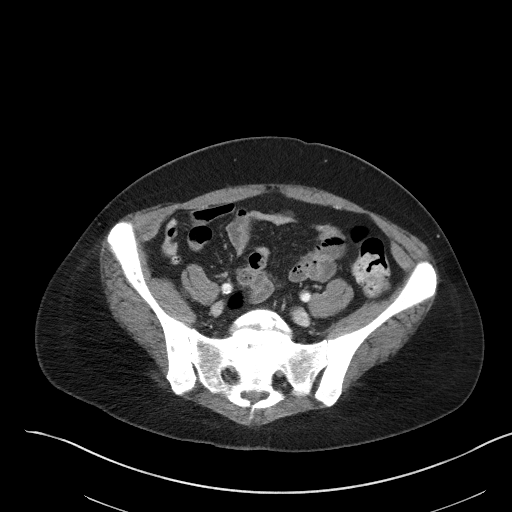
[im 40/89  soft-tissue]
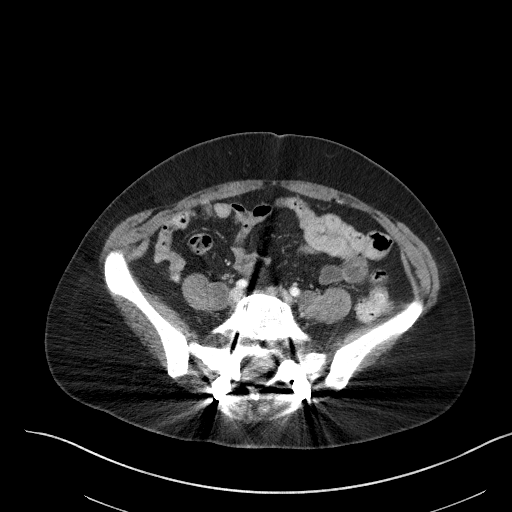
[im 49/89  soft-tissue]
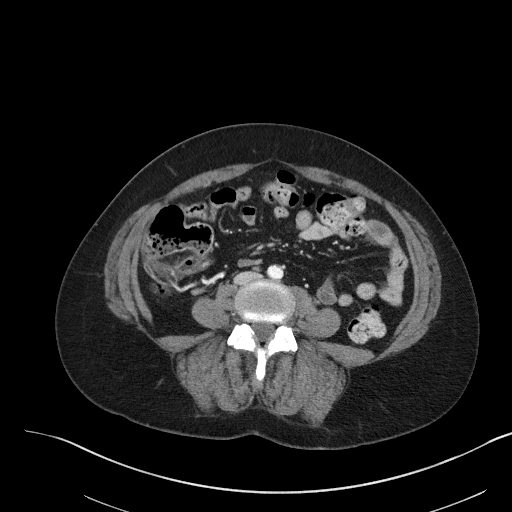
[im 53/89  soft-tissue]
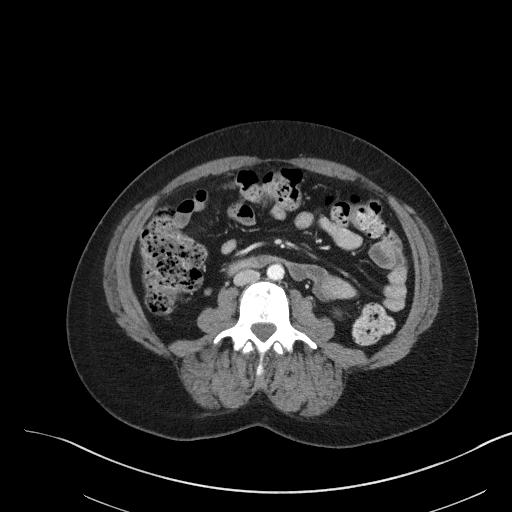
[im 53/89  bone]
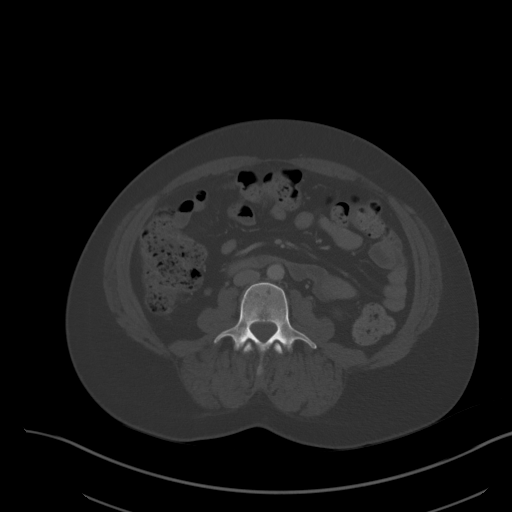
[im 58/89  soft-tissue]
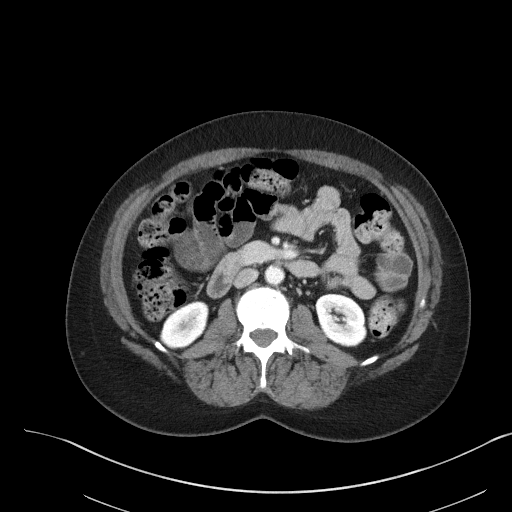
[im 67/89  soft-tissue]
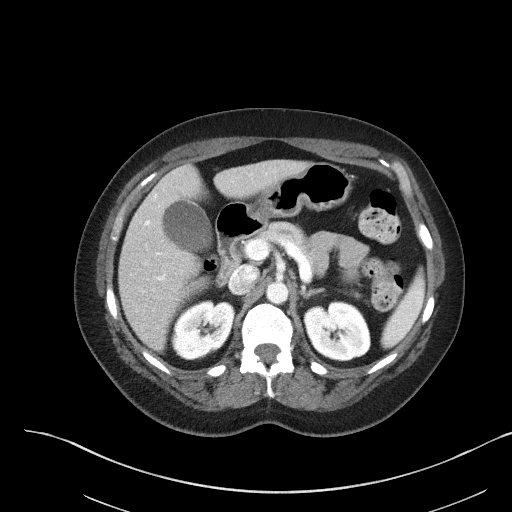
[im 71/89  soft-tissue]
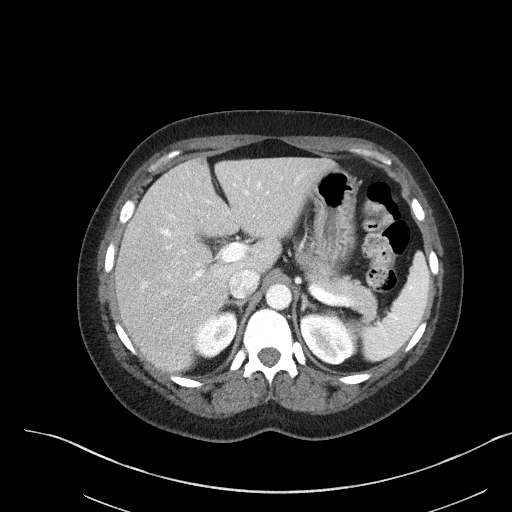
[im 75/89  soft-tissue]
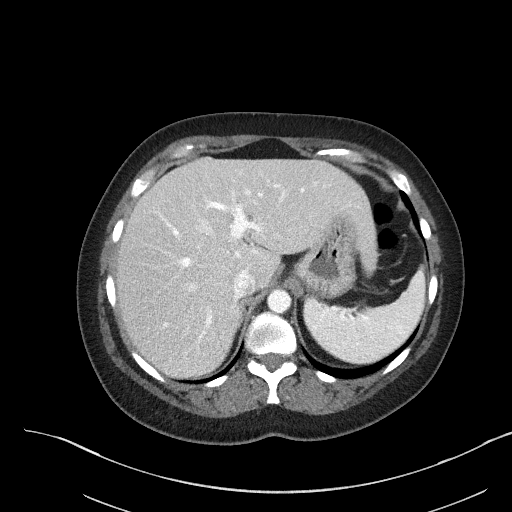
[im 84/89  soft-tissue]
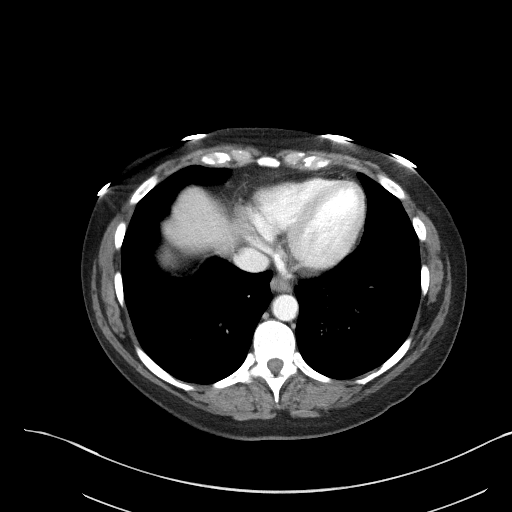

[Series 5: coronal st · coronal · 0.77mm/px · 3 of 85 slices shown]
[im 29/85  soft-tissue]
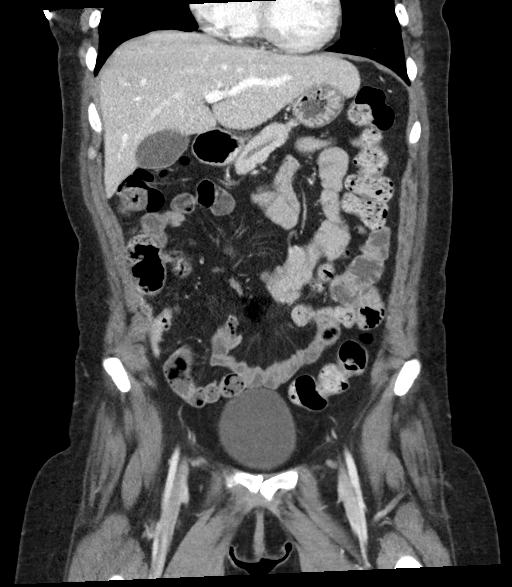
[im 38/85  soft-tissue]
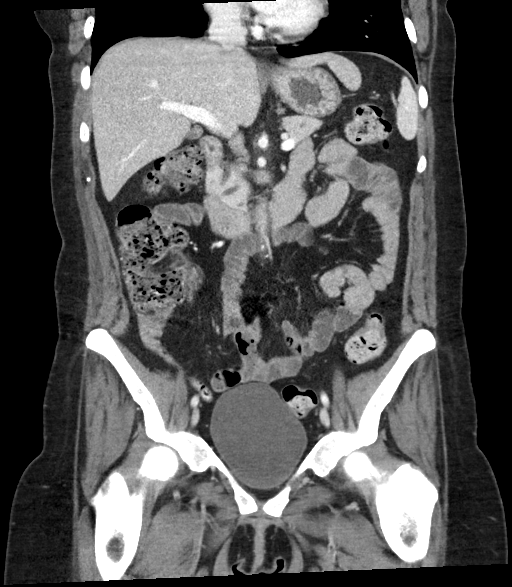
[im 47/85  soft-tissue]
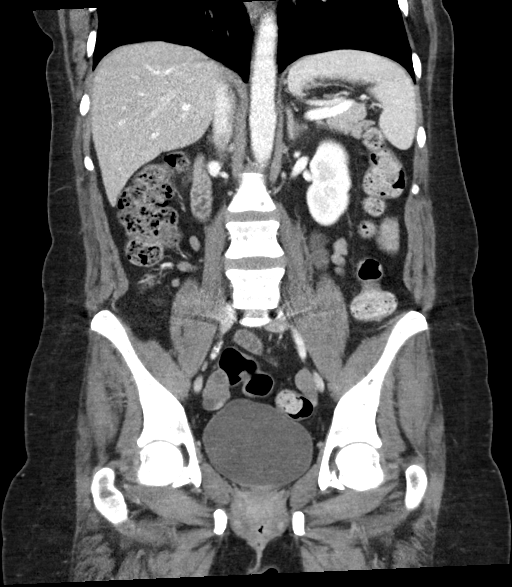

[17 of 46 positions shown; findings below may reference images not displayed]

FINDINGS: Lower chest: No acute abnormality.

Hepatobiliary: Fatty infiltration of the liver is noted. The
gallbladder is within normal limits.

Pancreas: Unremarkable. No pancreatic ductal dilatation or
surrounding inflammatory changes.

Spleen: Normal in size without focal abnormality.

Adrenals/Urinary Tract: Adrenal glands are within normal limits.
Kidneys demonstrate a normal enhancement pattern. No renal calculi
or obstructive changes are noted. Normal excretion of contrast is
noted bilaterally. The bladder is well distended.

Stomach/Bowel: No obstructive or inflammatory changes of the colon
are seen. The appendix is within normal limits. No small bowel or
gastric abnormality is noted.

Vascular/Lymphatic: Aortic atherosclerosis. No enlarged abdominal or
pelvic lymph nodes.

Reproductive: Status post hysterectomy. No adnexal masses.

Other: No abdominal wall hernia or abnormality. No abdominopelvic
ascites.

Musculoskeletal: Postsurgical changes in the lower lumbar spine are
noted. Stable anterolisthesis of L5 on S1 is noted.
IMPRESSION: Fatty liver.

No other focal abnormality is noted.

## 2022-03-30 ENCOUNTER — Other Ambulatory Visit: Payer: Self-pay

## 2023-08-30 DIAGNOSIS — E892 Postprocedural hypoparathyroidism: Secondary | ICD-10-CM | POA: Diagnosis not present

## 2023-08-30 DIAGNOSIS — E890 Postprocedural hypothyroidism: Secondary | ICD-10-CM | POA: Diagnosis not present

## 2023-09-23 DIAGNOSIS — Z6824 Body mass index (BMI) 24.0-24.9, adult: Secondary | ICD-10-CM | POA: Diagnosis not present

## 2023-09-23 DIAGNOSIS — E559 Vitamin D deficiency, unspecified: Secondary | ICD-10-CM | POA: Diagnosis not present

## 2023-09-23 DIAGNOSIS — Z79899 Other long term (current) drug therapy: Secondary | ICD-10-CM | POA: Diagnosis not present

## 2023-09-23 DIAGNOSIS — M79604 Pain in right leg: Secondary | ICD-10-CM | POA: Diagnosis not present

## 2023-09-23 DIAGNOSIS — M5489 Other dorsalgia: Secondary | ICD-10-CM | POA: Diagnosis not present

## 2023-09-23 DIAGNOSIS — M79605 Pain in left leg: Secondary | ICD-10-CM | POA: Diagnosis not present

## 2023-09-23 DIAGNOSIS — Z131 Encounter for screening for diabetes mellitus: Secondary | ICD-10-CM | POA: Diagnosis not present

## 2023-09-23 DIAGNOSIS — Z1159 Encounter for screening for other viral diseases: Secondary | ICD-10-CM | POA: Diagnosis not present

## 2023-09-23 DIAGNOSIS — M129 Arthropathy, unspecified: Secondary | ICD-10-CM | POA: Diagnosis not present

## 2023-09-28 DIAGNOSIS — Z79899 Other long term (current) drug therapy: Secondary | ICD-10-CM | POA: Diagnosis not present

## 2023-10-03 DIAGNOSIS — Z6823 Body mass index (BMI) 23.0-23.9, adult: Secondary | ICD-10-CM | POA: Diagnosis not present

## 2023-10-03 DIAGNOSIS — Z23 Encounter for immunization: Secondary | ICD-10-CM | POA: Diagnosis not present

## 2023-10-03 DIAGNOSIS — Z79899 Other long term (current) drug therapy: Secondary | ICD-10-CM | POA: Diagnosis not present

## 2023-10-03 DIAGNOSIS — M5489 Other dorsalgia: Secondary | ICD-10-CM | POA: Diagnosis not present

## 2023-10-03 DIAGNOSIS — M79604 Pain in right leg: Secondary | ICD-10-CM | POA: Diagnosis not present

## 2023-10-03 DIAGNOSIS — M79605 Pain in left leg: Secondary | ICD-10-CM | POA: Diagnosis not present

## 2023-10-03 DIAGNOSIS — F17210 Nicotine dependence, cigarettes, uncomplicated: Secondary | ICD-10-CM | POA: Diagnosis not present

## 2023-10-06 DIAGNOSIS — Z79899 Other long term (current) drug therapy: Secondary | ICD-10-CM | POA: Diagnosis not present

## 2023-10-13 DIAGNOSIS — R110 Nausea: Secondary | ICD-10-CM | POA: Diagnosis not present

## 2023-10-13 DIAGNOSIS — K219 Gastro-esophageal reflux disease without esophagitis: Secondary | ICD-10-CM | POA: Diagnosis not present

## 2023-10-13 DIAGNOSIS — M79672 Pain in left foot: Secondary | ICD-10-CM | POA: Diagnosis not present

## 2023-10-21 DIAGNOSIS — M79604 Pain in right leg: Secondary | ICD-10-CM | POA: Diagnosis not present

## 2023-10-21 DIAGNOSIS — F17210 Nicotine dependence, cigarettes, uncomplicated: Secondary | ICD-10-CM | POA: Diagnosis not present

## 2023-10-21 DIAGNOSIS — M79605 Pain in left leg: Secondary | ICD-10-CM | POA: Diagnosis not present

## 2023-10-21 DIAGNOSIS — M5489 Other dorsalgia: Secondary | ICD-10-CM | POA: Diagnosis not present

## 2023-10-21 DIAGNOSIS — Z79899 Other long term (current) drug therapy: Secondary | ICD-10-CM | POA: Diagnosis not present

## 2023-10-21 DIAGNOSIS — Z6823 Body mass index (BMI) 23.0-23.9, adult: Secondary | ICD-10-CM | POA: Diagnosis not present

## 2023-10-25 DIAGNOSIS — Z79899 Other long term (current) drug therapy: Secondary | ICD-10-CM | POA: Diagnosis not present

## 2023-10-27 DIAGNOSIS — F17210 Nicotine dependence, cigarettes, uncomplicated: Secondary | ICD-10-CM | POA: Diagnosis not present

## 2023-10-27 DIAGNOSIS — Z7982 Long term (current) use of aspirin: Secondary | ICD-10-CM | POA: Diagnosis not present

## 2023-10-27 DIAGNOSIS — S99922A Unspecified injury of left foot, initial encounter: Secondary | ICD-10-CM | POA: Diagnosis not present

## 2023-10-27 DIAGNOSIS — I509 Heart failure, unspecified: Secondary | ICD-10-CM | POA: Diagnosis not present

## 2023-10-27 DIAGNOSIS — M48062 Spinal stenosis, lumbar region with neurogenic claudication: Secondary | ICD-10-CM | POA: Diagnosis not present

## 2023-10-27 DIAGNOSIS — R936 Abnormal findings on diagnostic imaging of limbs: Secondary | ICD-10-CM | POA: Diagnosis not present

## 2023-10-27 DIAGNOSIS — K219 Gastro-esophageal reflux disease without esophagitis: Secondary | ICD-10-CM | POA: Diagnosis not present

## 2023-10-27 DIAGNOSIS — M545 Low back pain, unspecified: Secondary | ICD-10-CM | POA: Diagnosis not present

## 2023-10-27 DIAGNOSIS — M79672 Pain in left foot: Secondary | ICD-10-CM | POA: Diagnosis not present

## 2023-10-27 DIAGNOSIS — J45909 Unspecified asthma, uncomplicated: Secondary | ICD-10-CM | POA: Diagnosis not present

## 2023-10-27 DIAGNOSIS — R569 Unspecified convulsions: Secondary | ICD-10-CM | POA: Diagnosis not present

## 2023-11-18 DIAGNOSIS — M79605 Pain in left leg: Secondary | ICD-10-CM | POA: Diagnosis not present

## 2023-11-18 DIAGNOSIS — Z79899 Other long term (current) drug therapy: Secondary | ICD-10-CM | POA: Diagnosis not present

## 2023-11-18 DIAGNOSIS — G8929 Other chronic pain: Secondary | ICD-10-CM | POA: Diagnosis not present

## 2023-11-18 DIAGNOSIS — F17210 Nicotine dependence, cigarettes, uncomplicated: Secondary | ICD-10-CM | POA: Diagnosis not present

## 2023-11-18 DIAGNOSIS — Z6823 Body mass index (BMI) 23.0-23.9, adult: Secondary | ICD-10-CM | POA: Diagnosis not present

## 2023-11-18 DIAGNOSIS — M79604 Pain in right leg: Secondary | ICD-10-CM | POA: Diagnosis not present

## 2023-11-18 DIAGNOSIS — M5489 Other dorsalgia: Secondary | ICD-10-CM | POA: Diagnosis not present

## 2023-11-22 DIAGNOSIS — Z79899 Other long term (current) drug therapy: Secondary | ICD-10-CM | POA: Diagnosis not present
